# Patient Record
Sex: Male | Born: 1985 | Race: White | Hispanic: No | Marital: Single | Smoking: Current every day smoker
Health system: Southern US, Community
[De-identification: ages and names within clinical notes are randomized; demographics above are authoritative.]

## PROBLEM LIST (undated history)

## (undated) DIAGNOSIS — L729 Follicular cyst of the skin and subcutaneous tissue, unspecified: Secondary | ICD-10-CM

## (undated) DIAGNOSIS — R05 Cough: Secondary | ICD-10-CM

## (undated) DIAGNOSIS — T07XXXA Unspecified multiple injuries, initial encounter: Secondary | ICD-10-CM

## (undated) DIAGNOSIS — R0989 Other specified symptoms and signs involving the circulatory and respiratory systems: Secondary | ICD-10-CM

## (undated) DIAGNOSIS — K37 Unspecified appendicitis: Secondary | ICD-10-CM

## (undated) DIAGNOSIS — K029 Dental caries, unspecified: Secondary | ICD-10-CM

## (undated) DIAGNOSIS — R569 Unspecified convulsions: Secondary | ICD-10-CM

## (undated) HISTORY — PX: APPENDECTOMY: SHX54

---

## 2009-08-19 ENCOUNTER — Emergency Department (HOSPITAL_COMMUNITY): Admission: EM | Admit: 2009-08-19 | Discharge: 2009-08-19 | Payer: Self-pay | Admitting: Emergency Medicine

## 2011-04-19 ENCOUNTER — Encounter: Payer: Self-pay | Admitting: *Deleted

## 2011-04-19 ENCOUNTER — Emergency Department (HOSPITAL_COMMUNITY)
Admission: EM | Admit: 2011-04-19 | Discharge: 2011-04-19 | Discharge status: Against Medical Advice | Payer: Self-pay | Attending: Emergency Medicine | Admitting: Emergency Medicine

## 2011-04-19 DIAGNOSIS — K029 Dental caries, unspecified: Secondary | ICD-10-CM | POA: Insufficient documentation

## 2011-04-19 DIAGNOSIS — K089 Disorder of teeth and supporting structures, unspecified: Secondary | ICD-10-CM | POA: Insufficient documentation

## 2011-04-19 NOTE — ED Notes (Signed)
Patient c/o toothache onset several months ago , states it has just gotten really bad in the past month. 3-4 rotten upper teeth on the left.

## 2011-04-19 NOTE — ED Notes (Signed)
No answer x1

## 2011-04-25 ENCOUNTER — Encounter (HOSPITAL_COMMUNITY): Payer: Self-pay | Admitting: Emergency Medicine

## 2011-04-25 ENCOUNTER — Emergency Department (HOSPITAL_COMMUNITY)
Admission: EM | Admit: 2011-04-25 | Discharge: 2011-04-25 | Disposition: A | Discharge status: Home / Self Care | Payer: Self-pay | Attending: Emergency Medicine | Admitting: Emergency Medicine

## 2011-04-25 DIAGNOSIS — K029 Dental caries, unspecified: Secondary | ICD-10-CM | POA: Insufficient documentation

## 2011-04-25 DIAGNOSIS — K047 Periapical abscess without sinus: Secondary | ICD-10-CM | POA: Insufficient documentation

## 2011-04-25 DIAGNOSIS — R599 Enlarged lymph nodes, unspecified: Secondary | ICD-10-CM | POA: Insufficient documentation

## 2011-04-25 MED ORDER — PENICILLIN V POTASSIUM 250 MG PO TABS: 250.0000 mg | ORAL_TABLET | Freq: Four times a day (QID) | ORAL | Status: AC

## 2011-04-25 MED ORDER — OXYCODONE-ACETAMINOPHEN 5-325 MG PO TABS: 1.0000 | ORAL_TABLET | Freq: Four times a day (QID) | ORAL | Status: AC | PRN

## 2011-04-25 NOTE — ED Provider Notes (Signed)
History     CSN: [id number] Arrival date & time: 04/25/2011  9:15 AM   First MD Initiated Contact with Patient 04/25/11 [phone]      Chief Complaint  Patient presents with  . Dental Pain    left upper jaw  . Facial Swelling    knot under left jaw    (Consider location/radiation/quality/duration/timing/severity/associated sxs/prior treatment) The history is provided by the patient.    Pt presents to the ED with complaints of dental abscess that he stuck a needle into himself. He has widespread tooth decay and gingivitis. He also complains of a non painful lump on the side of his neck. Denies fevers, N/V/D/neck soreness. Pt says taht he attempted to get his teeth pulled by the dentist said he couldn't do it while its infected.  History reviewed. No pertinent past medical history.  Past Surgical History  Procedure Date  . Appendectomy     No family history on file.  History  Substance Use Topics  . Smoking status: Current Everyday Smoker -- 1.0 packs/day  . Smokeless tobacco: Not on file  . Alcohol Use: No      Review of Systems  All other systems reviewed and are negative.    Allergies  Ultram  Home Medications   Current Outpatient Rx  Name Route Sig Dispense Refill  . ACETAMINOPHEN 500 MG PO TABS Oral Take 1,000 mg by mouth every 6 (six) hours as needed. For pain       BP 119/78  Pulse 84  Temp(Src) 98.1 F (36.7 C) (Oral)  Ht 5\' 9"  (1.753 m)  Wt 128 lb (58.06 kg)  BMI 18.90 kg/m2  SpO2 98%  Physical Exam  Constitutional: He is oriented to person, place, and time. He appears well-developed and well-nourished.  HENT:  Head: Normocephalic and atraumatic.  Mouth/Throat: Uvula is midline and oropharynx is clear and moist. Dental abscesses and dental caries present.  Eyes: EOM are normal. Pupils are equal, round, and reactive to light.  Neck: Normal range of motion.  Cardiovascular: Normal rate and regular rhythm.   Pulmonary/Chest: Effort normal and  breath sounds normal.  Musculoskeletal: Normal range of motion.  Lymphadenopathy:       Head (left side): Submandibular (node is enlarged, nontender, smooth, mobile and not indurated. appears to be bengign in reaction to the infection in his teeth) adenopathy present.  Neurological: He is alert and oriented to person, place, and time.  Skin: Skin is warm and dry.    ED Course  Procedures (including critical care time)  Labs Reviewed - No data to display No results found.   No diagnosis found.    MDM  Discussed the patient seeing a dentist right at the end of his abx treatment for best chance of getting teeth pulled.       Dorthula Matas, PA 04/25/11 1010

## 2011-04-25 NOTE — ED Notes (Signed)
Pt has dental pain. Obvious dental caries, gums were swollen.  Last night pt stuck needle into gums to release the pressure

## 2011-04-25 NOTE — ED Provider Notes (Signed)
Medical screening examination/treatment/procedure(s) were performed by non-physician practitioner and as supervising physician I was immediately available for consultation/collaboration.  Press Casale R. Rumi Taras, MD 04/25/11 1529 

## 2011-08-26 ENCOUNTER — Emergency Department (HOSPITAL_COMMUNITY)
Admission: EM | Admit: 2011-08-26 | Discharge: 2011-08-26 | Disposition: A | Discharge status: Home / Self Care | Payer: Self-pay | Attending: Emergency Medicine | Admitting: Emergency Medicine

## 2011-08-26 ENCOUNTER — Encounter (HOSPITAL_COMMUNITY): Payer: Self-pay | Admitting: *Deleted

## 2011-08-26 DIAGNOSIS — F17200 Nicotine dependence, unspecified, uncomplicated: Secondary | ICD-10-CM | POA: Insufficient documentation

## 2011-08-26 DIAGNOSIS — Z886 Allergy status to analgesic agent status: Secondary | ICD-10-CM | POA: Insufficient documentation

## 2011-08-26 DIAGNOSIS — K047 Periapical abscess without sinus: Secondary | ICD-10-CM | POA: Insufficient documentation

## 2011-08-26 DIAGNOSIS — K029 Dental caries, unspecified: Secondary | ICD-10-CM | POA: Insufficient documentation

## 2011-08-26 MED ORDER — PENICILLIN V POTASSIUM 500 MG PO TABS: 500.0000 mg | ORAL_TABLET | Freq: Four times a day (QID) | ORAL | Status: AC

## 2011-08-26 MED ORDER — IBUPROFEN 800 MG PO TABS: 800.0000 mg | ORAL_TABLET | Freq: Three times a day (TID) | ORAL | Status: AC | PRN

## 2011-08-26 MED ORDER — HYDROCODONE-ACETAMINOPHEN 5-325 MG PO TABS: 1.0000 | ORAL_TABLET | Freq: Four times a day (QID) | ORAL | Status: AC | PRN

## 2011-08-26 MED ORDER — PENICILLIN V POTASSIUM 500 MG PO TABS: 500.0000 mg | ORAL_TABLET | Freq: Four times a day (QID) | ORAL | Status: DC

## 2011-08-26 NOTE — ED Notes (Signed)
PA at bedside.

## 2011-08-26 NOTE — ED Notes (Signed)
Pt from home with reports of left facial swelling due to rotten tooth on top left, also reports minimal pain, symptoms began Wednesday with swelling that began last night.

## 2011-08-26 NOTE — Discharge Instructions (Signed)
Please call the dentist listed above for a follow up appointment.  You should call the office within 48 hours of being seen in the ER and let them know you were seen here.  If you develop high fevers, swelling that involves your eye, difficulty swallowing or breathing, or are unable to tolerate fluids by mouth, return to the ER immediately.  You may return to the ER at any time for worsening condition or any new symptoms that concern you.  Dental Abscess A dental abscess usually starts from an infected tooth. Antibiotic medicine and pain pills can be helpful, but dental infections require the attention of a dentist. Rinse around the infected area often with salt water (a pinch of salt in 8 oz of warm water). Do not apply heat to the outside of your face. See your dentist or oral surgeon as soon as possible.  SEEK IMMEDIATE MEDICAL CARE IF:  You have increasing, severe pain that is not relieved by medicine.   You or your child has an oral temperature above 102 F (38.9 C), not controlled by medicine.   Your baby is older than 3 months with a rectal temperature of 102 F (38.9 C) or higher.   Your baby is 80 months old or younger with a rectal temperature of 100.4 F (38 C) or higher.   You develop chills, severe headache, difficulty breathing, or trouble swallowing.   You have swelling in the neck or around the eye.  Document Released: 05/01/2005 Document Revised: 04/20/2011 Document Reviewed: 10/10/2006 [facility] Patient Information 2012 [place], [place].

## 2011-08-26 NOTE — ED Provider Notes (Signed)
History     CSN: [id number]  Arrival date & time 08/26/11  1006   First MD Initiated Contact with Patient 08/26/11 1029      Chief Complaint  Patient presents with  . Dental Pain  . Facial Swelling    (Consider location/radiation/quality/duration/timing/severity/associated sxs/prior treatment) HPI Comments: Patient reports left upper molar pain that began 4 days ago.  States he now have swelling that began last night.  Subjective fevers, intermittent sore throat.  Denies any difficulty swallowing or breathing.  No known medical problems. Pt does not have medical insurance and has been calling dentists that all require money up front, therefore unable to see dentist.    Patient is a 26 y.o. male presenting with tooth pain. The history is provided by the patient.  Dental PainThe primary symptoms include sore throat. Primary symptoms do not include fever or shortness of breath.  The sore throat is not accompanied by trouble swallowing, drooling or stridor.   Additional symptoms do not include: trouble swallowing and drooling.    History reviewed. No pertinent past medical history.  Past Surgical History  Procedure Date  . Appendectomy     History reviewed. No pertinent family history.  History  Substance Use Topics  . Smoking status: Current Everyday Smoker -- 1.0 packs/day    Types: Cigarettes  . Smokeless tobacco: Never Used  . Alcohol Use: Yes     seldom      Review of Systems  Constitutional: Negative for fever and chills.  HENT: Positive for sore throat and dental problem. Negative for drooling and trouble swallowing.   Respiratory: Negative for shortness of breath, wheezing and stridor.   Cardiovascular: Negative for chest pain.  Gastrointestinal: Negative for vomiting and abdominal pain.  All other systems reviewed and are negative.    Allergies  Aspirin and Ultram  Home Medications   Current Outpatient Rx  Name Route Sig Dispense Refill  . IBUPROFEN  200 MG PO TABS Oral Take 200 mg by mouth every 6 (six) hours as needed. pain      BP 132/68  Pulse 78  Temp(Src) 97.7 F (36.5 C) (Oral)  Resp 18  SpO2 100%  Physical Exam  Nursing note and vitals reviewed. Constitutional: He is oriented to person, place, and time. He appears well-developed and well-nourished.  HENT:  Head: Normocephalic and atraumatic.  Mouth/Throat:         Left upper molars with severe decay, swelling adjacent and superior to this area.  Left lower jaw also with area of swelling and associated rubbery nonfixed lymph node.    Neck: Neck supple.  Pulmonary/Chest: Effort normal.  Neurological: He is alert and oriented to person, place, and time.    ED Course  Procedures (including critical care time)  Labs Reviewed - No data to display No results found.   1. Dental abscess       MDM  Afebrile nontoxic patient with advanced dental decay in multiple teeth.  Now with dental abscess.  No airway involvement at this time.  Pt d/c home with dental follow up.  Return precautions discussed.  Patient verbalizes understanding and agrees with plan.         Rise Patience, [place] 08/26/11 1355

## 2011-09-09 NOTE — ED Provider Notes (Signed)
Evaluation and management procedures were performed by the PA/NP/resident physician under my supervision/collaboration.   Kiano Terrien D Adylee Leonardo, MD 09/09/11 2017 

## 2012-06-07 ENCOUNTER — Emergency Department (HOSPITAL_COMMUNITY): Payer: Medicaid Other

## 2012-06-07 ENCOUNTER — Encounter (HOSPITAL_COMMUNITY): Payer: Self-pay | Admitting: Emergency Medicine

## 2012-06-07 ENCOUNTER — Emergency Department (HOSPITAL_COMMUNITY)
Admission: EM | Admit: 2012-06-07 | Discharge: 2012-06-07 | Disposition: A | Discharge status: Home / Self Care | Payer: Medicaid Other | Attending: Emergency Medicine | Admitting: Emergency Medicine

## 2012-06-07 DIAGNOSIS — Y939 Activity, unspecified: Secondary | ICD-10-CM | POA: Insufficient documentation

## 2012-06-07 DIAGNOSIS — X58XXXA Exposure to other specified factors, initial encounter: Secondary | ICD-10-CM | POA: Insufficient documentation

## 2012-06-07 DIAGNOSIS — F17200 Nicotine dependence, unspecified, uncomplicated: Secondary | ICD-10-CM | POA: Insufficient documentation

## 2012-06-07 DIAGNOSIS — Y929 Unspecified place or not applicable: Secondary | ICD-10-CM | POA: Insufficient documentation

## 2012-06-07 DIAGNOSIS — I889 Nonspecific lymphadenitis, unspecified: Secondary | ICD-10-CM

## 2012-06-07 LAB — COMPREHENSIVE METABOLIC PANEL
ALT: 13 U/L (ref 0–53)
Calcium: 9.5 mg/dL (ref 8.4–10.5)
GFR calc Af Amer: >90 mL/min (ref >90)
Glucose, Bld: 87 mg/dL (ref 70–99)
Sodium: 138 mEq/L (ref 135–145)
Total Protein: 7.8 g/dL (ref 6.0–8.3)

## 2012-06-07 LAB — CBC WITH DIFFERENTIAL/PLATELET
Basophils Absolute: 0 10*3/uL (ref 0.0–0.1)
Basophils Relative: 0 % (ref 0–1)
Eosinophils Absolute: 0.2 10*3/uL (ref 0.0–0.7)
Eosinophils Relative: 1 % (ref 0–5)
Lymphs Abs: 2.3 10*3/uL (ref 0.7–4.0)
MCH: 32.9 pg (ref 26.0–34.0)
MCHC: 35.8 g/dL (ref 30.0–36.0)
MCV: 91.8 fL (ref 78.0–100.0)
Platelets: 160 10*3/uL (ref 150–400)
RDW: 13.9 % (ref 11.5–15.5)

## 2012-06-07 MED ORDER — AMOXICILLIN-POT CLAVULANATE 875-125 MG PO TABS: 1.0000 | ORAL_TABLET | Freq: Two times a day (BID) | ORAL | Status: DC

## 2012-06-07 MED ORDER — IOHEXOL 300 MG/ML  SOLN
80.0000 mL | Freq: Once | INTRAMUSCULAR | Status: AC | PRN
  Administered 2012-06-07: 80 mL via INTRAVENOUS

## 2012-06-07 MED ORDER — DEXTROSE 5 % IV SOLN
1.0000 g | INTRAVENOUS | Status: DC
  Administered 2012-06-07: 1 g via INTRAVENOUS
  Filled 2012-06-07: qty 10

## 2012-06-07 MED ORDER — SODIUM CHLORIDE 0.9 % IV SOLN
Freq: Once | INTRAVENOUS | Status: AC
  Administered 2012-06-07: 1000 mL via INTRAVENOUS

## 2012-06-07 MED ORDER — VANCOMYCIN HCL IN DEXTROSE 1-5 GM/200ML-% IV SOLN
1000.0000 mg | Freq: Once | INTRAVENOUS | Status: AC
  Administered 2012-06-07: 1000 mg via INTRAVENOUS
  Filled 2012-06-07: qty 200

## 2012-06-07 NOTE — ED Notes (Signed)
Knot on jaw that has gotten bigger ? Tooth that is bad x 1 week

## 2012-06-07 NOTE — ED Notes (Signed)
Patient transported to CT 

## 2012-06-07 NOTE — ED Provider Notes (Signed)
Medical screening examination/treatment/procedure(s) were performed by non-physician practitioner and as supervising physician I was immediately available for consultation/collaboration.   Emoree Sasaki J. Dayra Rapley, MD 06/07/12 1612 

## 2012-06-07 NOTE — ED Notes (Signed)
Patient transported to Ultrasound 

## 2012-06-07 NOTE — ED Provider Notes (Signed)
History     CSN: [id number]  Arrival date & time 06/07/12  1353   None     No chief complaint on file.   (Consider location/radiation/quality/duration/timing/severity/associated sxs/prior treatment) Patient is a 27 y.o. male presenting with neck injury. The history is provided by the patient. No language interpreter was used.  Neck Injury This is a new problem. The problem occurs constantly. The problem has been gradually worsening. Associated symptoms include neck pain. Associated symptoms comments: Dental infection  In the pst with swollen glands. Pt reports he had a lymph node that did not go down.  . Nothing aggravates the symptoms. He has tried nothing for the symptoms. The treatment provided moderate relief.    History reviewed. No pertinent past medical history.  Past Surgical History  Procedure Date  . Appendectomy     No family history on file.  History  Substance Use Topics  . Smoking status: Current Every Day Smoker -- 1.0 packs/day    Types: Cigarettes  . Smokeless tobacco: Never Used  . Alcohol Use: Yes     Comment: seldom      Review of Systems  HENT: Positive for facial swelling and neck pain.   All other systems reviewed and are negative.    Allergies  Ultram  Home Medications   Current Outpatient Rx  Name  Route  Sig  Dispense  Refill  . ASPIRIN 325 MG PO TABS   Oral   Take 325 mg by mouth daily.           BP 145/76  Pulse 86  Temp 98.5 F (36.9 C)  Resp 16  SpO2 100%  Physical Exam  Nursing note and vitals reviewed. Constitutional: He is oriented to person, place, and time. He appears well-developed and well-nourished.  HENT:  Head: Normocephalic.       Large swollen area left mandibular curve,  3 cm area,  1cm lymph node occipital  Eyes: Pupils are equal, round, and reactive to light.  Neck: Normal range of motion.  Cardiovascular: Normal rate.   Pulmonary/Chest: Effort normal.  Musculoskeletal: Normal range of motion.   Neurological: He is alert and oriented to person, place, and time. He has normal reflexes.  Skin: Skin is warm.  Psychiatric: He has a normal mood and affect.    ED Course  Procedures (including critical care time)  Labs Reviewed - No data to display No results found.   No diagnosis found.    MDM  Pt's care turned over to Felicie Morn NP labs, ultrasound and antibiotics       Lonia Skinner [place], [place] 06/07/12 1540

## 2012-06-09 DIAGNOSIS — D72829 Elevated white blood cell count, unspecified: Secondary | ICD-10-CM | POA: Diagnosis present

## 2012-06-09 DIAGNOSIS — Z9119 Patient's noncompliance with other medical treatment and regimen: Secondary | ICD-10-CM

## 2012-06-09 DIAGNOSIS — F17200 Nicotine dependence, unspecified, uncomplicated: Secondary | ICD-10-CM | POA: Diagnosis present

## 2012-06-09 DIAGNOSIS — L0211 Cutaneous abscess of neck: Principal | ICD-10-CM | POA: Diagnosis present

## 2012-06-09 DIAGNOSIS — L723 Sebaceous cyst: Secondary | ICD-10-CM | POA: Diagnosis present

## 2012-06-09 DIAGNOSIS — Z91199 Patient's noncompliance with other medical treatment and regimen due to unspecified reason: Secondary | ICD-10-CM

## 2012-06-09 DIAGNOSIS — I889 Nonspecific lymphadenitis, unspecified: Secondary | ICD-10-CM | POA: Diagnosis present

## 2012-06-09 NOTE — ED Notes (Signed)
PT. REPORTS PROGRESSING ABSCESS AT LEFT NECK WITH SWELLING / REDDNESS, NO DRAINAGE, SEEN HERE 06/07/2012 PRESCRIBED WITH AUGMENTIN ANTIBIOTIC WITH NO IMPROVEMENT.

## 2012-06-10 ENCOUNTER — Encounter (HOSPITAL_COMMUNITY): Payer: Self-pay | Admitting: Emergency Medicine

## 2012-06-10 ENCOUNTER — Inpatient Hospital Stay (HOSPITAL_COMMUNITY)
Admission: EM | Admit: 2012-06-10 | Discharge: 2012-06-13 | DRG: 603 | Disposition: A | Discharge status: Home / Self Care | Payer: Medicaid Other | Attending: Internal Medicine | Admitting: Internal Medicine

## 2012-06-10 DIAGNOSIS — M7989 Other specified soft tissue disorders: Secondary | ICD-10-CM

## 2012-06-10 DIAGNOSIS — Z9119 Patient's noncompliance with other medical treatment and regimen: Secondary | ICD-10-CM

## 2012-06-10 DIAGNOSIS — L723 Sebaceous cyst: Secondary | ICD-10-CM | POA: Diagnosis present

## 2012-06-10 DIAGNOSIS — R221 Localized swelling, mass and lump, neck: Secondary | ICD-10-CM | POA: Diagnosis present

## 2012-06-10 DIAGNOSIS — R220 Localized swelling, mass and lump, head: Secondary | ICD-10-CM

## 2012-06-10 DIAGNOSIS — I889 Nonspecific lymphadenitis, unspecified: Secondary | ICD-10-CM

## 2012-06-10 DIAGNOSIS — L089 Local infection of the skin and subcutaneous tissue, unspecified: Secondary | ICD-10-CM

## 2012-06-10 DIAGNOSIS — D72829 Elevated white blood cell count, unspecified: Secondary | ICD-10-CM | POA: Diagnosis present

## 2012-06-10 LAB — CBC WITH DIFFERENTIAL/PLATELET
Basophils Relative: 0 % (ref 0–1)
HCT: 40.4 % (ref 39.0–52.0)
Hemoglobin: 13.9 g/dL (ref 13.0–17.0)
Lymphocytes Relative: 21 % (ref 12–46)
MCHC: 34.4 g/dL (ref 30.0–36.0)
Monocytes Absolute: 1.1 10*3/uL — ABNORMAL HIGH (ref 0.1–1.0)
Monocytes Relative: 10 % (ref 3–12)
Neutro Abs: 7.7 10*3/uL (ref 1.7–7.7)

## 2012-06-10 LAB — COMPREHENSIVE METABOLIC PANEL
BUN: 7 mg/dL (ref 6–23)
CO2: 30 mEq/L (ref 19–32)
Chloride: 102 mEq/L (ref 96–112)
Creatinine, Ser: 0.82 mg/dL (ref 0.50–1.35)
GFR calc Af Amer: >90 mL/min (ref >90)
GFR calc non Af Amer: >90 mL/min (ref >90)
Glucose, Bld: 85 mg/dL (ref 70–99)
Total Bilirubin: 0.2 mg/dL — ABNORMAL LOW (ref 0.3–1.2)

## 2012-06-10 MED ORDER — ONDANSETRON HCL 4 MG/2ML IJ SOLN: 4.0000 mg | Freq: Three times a day (TID) | INTRAMUSCULAR | Status: DC | PRN

## 2012-06-10 MED ORDER — SODIUM CHLORIDE 0.9 % IV SOLN: 250.0000 mL | INTRAVENOUS | Status: DC | PRN

## 2012-06-10 MED ORDER — IBUPROFEN 600 MG PO TABS
600.0000 mg | ORAL_TABLET | Freq: Three times a day (TID) | ORAL | Status: DC | PRN
  Administered 2012-06-11: 600 mg via ORAL
  Filled 2012-06-10 (×2): qty 1

## 2012-06-10 MED ORDER — SODIUM CHLORIDE 0.9 % IV SOLN
3.0000 g | Freq: Four times a day (QID) | INTRAVENOUS | Status: DC
  Administered 2012-06-10 – 2012-06-13 (×13): 3 g via INTRAVENOUS
  Filled 2012-06-10 (×15): qty 3

## 2012-06-10 MED ORDER — SODIUM CHLORIDE 0.9 % IV SOLN
3.0000 g | Freq: Once | INTRAVENOUS | Status: AC
  Administered 2012-06-10: 3 g via INTRAVENOUS
  Filled 2012-06-10: qty 3

## 2012-06-10 MED ORDER — SODIUM CHLORIDE 0.9 % IJ SOLN: 3.0000 mL | INTRAMUSCULAR | Status: DC | PRN

## 2012-06-10 MED ORDER — LACTATED RINGERS IV SOLN: INTRAVENOUS | Status: DC

## 2012-06-10 MED ORDER — SODIUM CHLORIDE 0.9 % IJ SOLN
3.0000 mL | Freq: Two times a day (BID) | INTRAMUSCULAR | Status: DC
  Administered 2012-06-10 – 2012-06-13 (×4): 3 mL via INTRAVENOUS

## 2012-06-10 NOTE — H&P (Signed)
Chief Complaint:  Neck swelling  HPI: 27 yo healthy male came to ED over 2 days ago with a small swelling under his left mandible was dx with lymphadenitis given a dose of iv vanc and rocephin and a script for po augmentin and told to f/u with ENT as outpt.  However due to cost issues he could not fill the augmentin for 2 days and has only taken one dose of the augmentin.  Since that time the size of this "swelling" has over doubled in size.  There has been no draining from the area and there is increase in the redness around the area over the last 48 hours.  Eating well.  No n/v.  No fevers.  Review of Systems:  O/w neg  Past Medical History: History reviewed. No pertinent past medical history. Past Surgical History  Procedure Date  . Appendectomy     Medications: Prior to Admission medications   Medication Sig Start Date End Date Taking? Authorizing Provider  amoxicillin-clavulanate (AUGMENTIN) 875-125 MG per tablet Take 1 tablet by mouth every 12 (twelve) hours. 06/07/12  Yes Jimmye Norman, NP  ibuprofen (ADVIL,MOTRIN) 200 MG tablet Take 200-400 mg by mouth every 6 (six) hours as needed. For fever   Yes Historical Provider, MD    Allergies:   Allergies  Allergen Reactions  . Ultram (Tramadol Hcl) Swelling    Social History:  reports that he has been smoking Cigarettes.  He has been smoking about 1 pack per day. He has never used smokeless tobacco. He reports that he drinks alcohol. He reports that he does not use illicit drugs.  Family History: neg  Physical Exam: Filed Vitals:   06/09/12 2359  BP: 120/79  Pulse: 74  Temp: 98.4 F (36.9 C)  TempSrc: Oral  Resp: 14  SpO2: 99%   General appearance: alert, cooperative and no distress Neck: no JVD and supple, symmetrical, trachea midline large mass about 3-4 cm with fluntuance c/w absess under left mandible with surrounding erythema down to ant neck c/w cellulitis there is no spontaneous drainage anywhere Lungs:  clear to auscultation bilaterally Heart: regular rate and rhythm, S1, S2 normal, no murmur, click, rub or gallop Abdomen: soft, non-tender; bowel sounds normal; no masses,  no organomegaly Extremities: extremities normal, atraumatic, no cyanosis or edema Pulses: 2+ and symmetric Skin: Skin color, texture, turgor normal. No rashes or lesions Neurologic: Grossly normal    Labs on Admission:   [facility] 06/10/12 0023 06/07/12 1533  NA 140 138  K 4.2 4.7  CL 102 100  CO2 30 27  GLUCOSE 85 87  BUN 7 6  CREATININE 0.82 0.78  CALCIUM 9.5 9.5  MG -- --  PHOS -- --    Basename 06/10/12 0023 06/07/12 1533  AST 23 29  ALT 11 13  ALKPHOS 55 53  BILITOT 0.2* 0.3  PROT 7.4 7.8  ALBUMIN 4.0 4.5    Basename 06/10/12 0023 06/07/12 1533  WBC 11.5* 11.8*  NEUTROABS 7.7 8.5*  HGB 13.9 16.1  HCT 40.4 45.0  MCV 91.8 91.8  PLT 140* 160   Radiological Exams on Admission: Ct Soft Tissue Neck W Contrast  06/07/2012  *RADIOLOGY REPORT*  Clinical Data: Knot on the left sided neck.  Hurts when turning.  CT NECK WITH CONTRAST  Technique:  Multidetector CT imaging of the neck was performed with intravenous contrast.  Contrast: 80mL OMNIPAQUE IOHEXOL 300 MG/ML  SOLN  Comparison: 06/07/2012 ultrasound.  Findings: Inferior to the left mandibular ramus -  body junction there is a nonspecific 2.9 x 1.8 x 2.5 cm mass corresponds to the patient's palpable abnormality  and ultrasound abnormality.  This is of low density but heterogeneous and does not appear to be a simple cyst on the corresponding ultrasound.  Diffuse inflammation of surrounding fat planes.  Etiology of this is indeterminate. This may represent infected brachial cleft abnormality, infected lymph node or primary mass.  Increased number of normal size to minimally prominent sized lymph nodes throughout the neck otherwise noted possibly reactive origin.  Primary neck mass otherwise not identified.  Very minimal asymmetry palatine tonsils greater on  the left.  Caries.  No evidence of jugular vein thrombosis.  Biapical bullae/blebs.  IMPRESSION: Inferior to the left mandibular ramus - body junction there is a nonspecific 2.9 x 1.8 x 2.5 cm mass corresponds to the patient's palpable abnormality  and ultrasound abnormality.  This is of low density but heterogeneous and does not appear to be a simple cyst on the corresponding ultrasound.  Diffuse inflammation of surrounding fat planes.  Etiology of this is indeterminate.  This may represent infected brachial cleft abnormality, infected lymph node or primary mass.  Increased number of normal size to minimally prominent sized lymph nodes throughout the neck otherwise noted possibly reactive origin.  Primary neck mass otherwise not identified.  Very minimal asymmetry palatine tonsils greater on the left.  Caries.   Original Report Authenticated By: Lacy Duverney, M.D.    US Soft Tissue Head/neck  06/07/2012  *RADIOLOGY REPORT*  Clinical Data: Left neck mass.  ULTRASOUND OF HEAD/NECK SOFT TISSUES  Technique:  Ultrasound examination of the head and neck soft tissues was performed in the area of clinical concern.  Comparison:  None.  Findings: The patients palpable abnormality near the angle of the mandible in the submandibular region corresponds to a 2.4 x 1.3 x 2.8 cm mass.  This is likely a solid mass but could potentially be an infected or hemorrhagic cyst.  It is quite superficial and is most likely an enlarged and infected lymph node (lymphadenitis). This does not have typical sonographic features of an abscess. Further evaluation with contrast enhanced CT scan of the neck would be helpful.  IMPRESSION:  2.4 x 1.3 x 2.8 cm superficial mass in the left submandibular region corresponding to the patient's palpable abnormality.  This is most likely lymphadenitis but could be a complex cyst or solid mass.  Further evaluation with contrast enhanced neck CT is suggested.   Original Report Authenticated By: Rudie Meyer,  M.D.     Assessment/Plan 27 yo male with left submandibular mass/absess/cellulitis  Principal Problem:  *Mass of neck Active Problems:  Medically noncompliant  Iv unasyn.  ENT already called to see pt for i&D, will keep npo and hold anticoagulants until surgical plan clear.  Med bed.  Obs.  Full code.  Breena Bevacqua A 06/10/2012, 4:04 AM

## 2012-06-10 NOTE — ED Notes (Addendum)
Pt was here 1/24 for an abscess on his L lower jaw.  Pt was given Vancomycin and Rocephin here and discharged on Augmentin PO.  Pt states that abscess has doubled in size since his visit.  Pt denies any dyspnea.  MD at bedside. Pt states that his prescription was not filled until 1/26 due to a lack of funds.
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
## 2012-06-10 NOTE — ED Provider Notes (Signed)
History     CSN: [id number]  Arrival date & time 06/09/12  2344   First MD Initiated Contact with Patient 06/10/12 [phone]      Chief Complaint  Patient presents with  . Abscess    (Consider location/radiation/quality/duration/timing/severity/associated sxs/prior treatment) HPIJoshua Bates is a 27 y.o. male with a history of a left-sided submandibular persisting lymph node that was irrigated approximately one week ago when he is put into a hep lock during a disagreement with a friend. After this disagreement, lymph node started swelling until the patient was seen in emergency department 3 days ago. Patient was evaluated with a CT of the head and neck as well as ultrasound of the neck. It is felt that no surgical intervention was required at that time-he was given IV antibiotics and discharged home with Augmentin. Patient did not get the Augmentin filled, 48 hours had passed before he took his first dose of antibiotics, this was earlier today and then he decided to come to the emergency department because the swelling in the lymph node had doubled. Is having pain in the region which is moderate to severe as well as fevers. He has no airway involvement, denies any shortness of breath, throat swelling, sensation in his throat is closing, headaches, nausea, vomiting, diarrhea, abdominal pain, chest pain.  History reviewed. No pertinent past medical history.  Past Surgical History  Procedure Date  . Appendectomy     No family history on file.  History  Substance Use Topics  . Smoking status: Current Every Day Smoker -- 1.0 packs/day    Types: Cigarettes  . Smokeless tobacco: Never Used  . Alcohol Use: Yes     Comment: seldom      Review of Systems At least 10pt or greater review of systems completed and are negative except where specified in the HPI.  Allergies  Ultram  Home Medications   Current Outpatient Rx  Name  Route  Sig  Dispense  Refill  . AMOXICILLIN-POT  CLAVULANATE 875-125 MG PO TABS   Oral   Take 1 tablet by mouth every 12 (twelve) hours.   14 tablet   0   . IBUPROFEN 200 MG PO TABS   Oral   Take 200-400 mg by mouth every 6 (six) hours as needed. For fever           BP 120/79  Pulse 74  Temp 98.4 F (36.9 C) (Oral)  Resp 14  SpO2 99%  Physical Exam  Nursing notes reviewed.  Electronic medical record reviewed. VITAL SIGNS:   Filed Vitals:   06/09/12 2359 06/10/12 0519 06/10/12 0533  BP: 120/79 112/70 119/67  Pulse: 74 66 64  Temp: 98.4 F (36.9 C) 97.6 F (36.4 C) 97.7 F (36.5 C)  TempSrc: Oral Oral Oral  Resp: 14 16 18   Height:   5\' 9"  (1.753 m)  Weight:   127 lb 3.2 oz (57.698 kg)  SpO2: 99% 100% 98%   CONSTITUTIONAL: Awake, oriented, appears non-toxic HENT: Atraumatic, normocephalic, oral mucosa pink and moist, airway patent. Nares patent without drainage. External ears normal. EYES: Conjunctiva clear, EOMI, PERRLA NECK: Trachea midline, non-tender, supple. Patient has a large erythematous mass at the left inferior submandibular region. This is about midway between the angle of the jaw and the mental protuberance.  This is firm and tender to palpation, there is also an area of surrounding mild cellulitis that extends down the patient left lateral neck and crosses the midline. Patient has some other posterior reactive  nodes  CARDIOVASCULAR: Normal heart rate, Normal rhythm, No murmurs, rubs, gallops PULMONARY/CHEST: Clear to auscultation, no rhonchi, wheezes, or rales. Symmetrical breath sounds. Non-tender. ABDOMINAL: Non-distended, soft, non-tender - no rebound or guarding.  BS normal. NEUROLOGIC: Non-focal, moving all four extremities, no gross sensory or motor deficits. EXTREMITIES: No clubbing, cyanosis, or edema SKIN: Warm, Dry, No erythema, No rash  ED Course  Procedures (including critical care time)  Labs Reviewed  CBC WITH DIFFERENTIAL - Abnormal; Notable for the following:    WBC 11.5 (*)      Platelets 140 (*)     Monocytes Absolute 1.1 (*)     All other components within normal limits  COMPREHENSIVE METABOLIC PANEL - Abnormal; Notable for the following:    Total Bilirubin 0.2 (*)     All other components within normal limits   No results found.   1. Lymphadenitis   2. Mass of neck   3. Medically noncompliant       MDM  [name] is a 27 y.o. male presenting with worsening lymphadenitis after he failed to fill a antibiotic prescription. It is difficult to tell whether or not antibiotics would've helped, however now the lymph node is quite a bit larger and a bedside ultrasound seems to have a collection of hypoechoic fluid within the center of the node. Think this patient has failed outpatient therapy and should be admitted for IV antibiotic therapy as well as evaluation by ENT. Discussed this case with Dr. Jenne Pane - to see pt in AM for evaluation.  Drainage does not need to be done emergently, no airway involvement.   D/W Dr. Montel Culver for admission from [facility]         Jones Skene, MD 06/10/12 [phone]

## 2012-06-10 NOTE — Progress Notes (Signed)
   Patient seen earlier today by my colleague Dr. Onalee Hua.  Patient seen and examined by me, had a base reviewed.  Left-sided facial cellulitis with fluid filled mass versus lymphangitis.  Dr. Jenne Pane of ENT service is following, patient is on Unasyn continue.  Clint Lipps Pager: [phone] 06/10/2012, 3:33 PM

## 2012-06-10 NOTE — Progress Notes (Signed)
VASCULAR LAB PRELIMINARY  PRELIMINARY  PRELIMINARY  PRELIMINARY  Left upper extremity venous Doppler completed.    Preliminary report:  There is no DVT or SVT noted in the left upper extremity.    Tobenna Needs, RVT 06/10/2012, 11:59 AM

## 2012-06-11 ENCOUNTER — Encounter (HOSPITAL_COMMUNITY): Payer: Self-pay | Admitting: Anesthesiology

## 2012-06-11 ENCOUNTER — Encounter (HOSPITAL_COMMUNITY)
Admission: EM | Disposition: A | Discharge status: Home / Self Care | Payer: Self-pay | Source: Home / Self Care | Attending: Internal Medicine

## 2012-06-11 ENCOUNTER — Inpatient Hospital Stay (HOSPITAL_COMMUNITY): Payer: Medicaid Other | Admitting: Anesthesiology

## 2012-06-11 DIAGNOSIS — D72829 Elevated white blood cell count, unspecified: Secondary | ICD-10-CM

## 2012-06-11 HISTORY — PX: INCISION AND DRAINAGE ABSCESS: SHX5864

## 2012-06-11 LAB — CBC
HCT: 39.7 % (ref 39.0–52.0)
Hemoglobin: 13.7 g/dL (ref 13.0–17.0)
MCHC: 34.5 g/dL (ref 30.0–36.0)
RBC: 4.33 MIL/uL (ref 4.22–5.81)

## 2012-06-11 LAB — BASIC METABOLIC PANEL
BUN: 7 mg/dL (ref 6–23)
Chloride: 104 mEq/L (ref 96–112)
GFR calc Af Amer: >90 mL/min (ref >90)
Potassium: 3.9 mEq/L (ref 3.5–5.1)
Sodium: 139 mEq/L (ref 135–145)

## 2012-06-11 LAB — SURGICAL PCR SCREEN: Staphylococcus aureus: NEGATIVE

## 2012-06-11 SURGERY — INCISION AND DRAINAGE, ABSCESS
Anesthesia: General | Site: Neck | Wound class: Dirty or Infected

## 2012-06-11 MED ORDER — ONDANSETRON HCL 4 MG/2ML IJ SOLN: 4.0000 mg | Freq: Four times a day (QID) | INTRAMUSCULAR | Status: DC | PRN

## 2012-06-11 MED ORDER — FENTANYL CITRATE 0.05 MG/ML IJ SOLN
INTRAMUSCULAR | Status: DC | PRN
  Administered 2012-06-11 (×2): 50 ug via INTRAVENOUS
  Administered 2012-06-11: 100 ug via INTRAVENOUS

## 2012-06-11 MED ORDER — METOCLOPRAMIDE HCL 5 MG/ML IJ SOLN: 10.0000 mg | Freq: Once | INTRAMUSCULAR | Status: DC | PRN

## 2012-06-11 MED ORDER — LACTATED RINGERS IV SOLN
INTRAVENOUS | Status: DC
  Administered 2012-06-11: 1000 mL via INTRAVENOUS
  Administered 2012-06-11: 10:00:00 via INTRAVENOUS

## 2012-06-11 MED ORDER — MIDAZOLAM HCL 5 MG/5ML IJ SOLN
INTRAMUSCULAR | Status: DC | PRN
  Administered 2012-06-11: 2 mg via INTRAVENOUS

## 2012-06-11 MED ORDER — LIDOCAINE-EPINEPHRINE 1 %-1:100000 IJ SOLN
INTRAMUSCULAR | Status: DC | PRN
  Administered 2012-06-11: 1 mL

## 2012-06-11 MED ORDER — LIDOCAINE HCL (CARDIAC) 20 MG/ML IV SOLN
INTRAVENOUS | Status: DC | PRN
  Administered 2012-06-11: 100 mg via INTRAVENOUS

## 2012-06-11 MED ORDER — ACETAMINOPHEN 325 MG PO TABS: 650.0000 mg | ORAL_TABLET | Freq: Four times a day (QID) | ORAL | Status: DC | PRN

## 2012-06-11 MED ORDER — SULFAMETHOXAZOLE-TMP DS 800-160 MG PO TABS
1.0000 | ORAL_TABLET | Freq: Two times a day (BID) | ORAL | Status: DC
  Administered 2012-06-11 – 2012-06-13 (×4): 1 via ORAL
  Filled 2012-06-11 (×5): qty 1

## 2012-06-11 MED ORDER — SUCCINYLCHOLINE CHLORIDE 20 MG/ML IJ SOLN
INTRAMUSCULAR | Status: DC | PRN
  Administered 2012-06-11: 120 mg via INTRAVENOUS

## 2012-06-11 MED ORDER — FENTANYL CITRATE 0.05 MG/ML IJ SOLN: 25.0000 ug | INTRAMUSCULAR | Status: DC | PRN

## 2012-06-11 MED ORDER — PROPOFOL 10 MG/ML IV BOLUS
INTRAVENOUS | Status: DC | PRN
  Administered 2012-06-11: 200 mg via INTRAVENOUS

## 2012-06-11 MED ORDER — ONDANSETRON HCL 4 MG/2ML IJ SOLN
INTRAMUSCULAR | Status: DC | PRN
  Administered 2012-06-11: 4 mg via INTRAVENOUS

## 2012-06-11 MED ORDER — LACTATED RINGERS IV SOLN
INTRAVENOUS | Status: DC | PRN
  Administered 2012-06-11: 10:00:00 via INTRAVENOUS

## 2012-06-11 MED ORDER — METOCLOPRAMIDE HCL 5 MG/ML IJ SOLN
INTRAMUSCULAR | Status: DC | PRN
  Administered 2012-06-11: 10 mg via INTRAVENOUS

## 2012-06-11 MED ORDER — SODIUM CHLORIDE 0.9 % IR SOLN
Status: DC | PRN
  Administered 2012-06-11: 1000 mL

## 2012-06-11 SURGICAL SUPPLY — 25 items
BANDAGE GAUZE ELAST BULKY 4 IN (GAUZE/BANDAGES/DRESSINGS) ×4 IMPLANT
CATH ROBINSON RED A/P 12FR (CATHETERS) ×2 IMPLANT
CLOTH BEACON ORANGE TIMEOUT ST (SAFETY) ×2 IMPLANT
COVER SURGICAL LIGHT HANDLE (MISCELLANEOUS) ×4 IMPLANT
DRAIN PENROSE 1/4X12 LTX STRL (WOUND CARE) ×2 IMPLANT
ELECT COATED BLADE 2.86 ST (ELECTRODE) ×2 IMPLANT
ELECT REM PT RETURN 9FT ADLT (ELECTROSURGICAL) ×2
ELECTRODE REM PT RTRN 9FT ADLT (ELECTROSURGICAL) ×1 IMPLANT
GLOVE BIO SURGEON STRL SZ7.5 (GLOVE) ×2 IMPLANT
GLOVE ECLIPSE 6.5 STRL STRAW (GLOVE) ×2 IMPLANT
GLOVE EUDERMIC 7 POWDERFREE (GLOVE) ×2 IMPLANT
GOWN STRL NON-REIN LRG LVL3 (GOWN DISPOSABLE) ×4 IMPLANT
KIT BASIN OR (CUSTOM PROCEDURE TRAY) ×2 IMPLANT
KIT ROOM TURNOVER OR (KITS) ×2 IMPLANT
NEEDLE HYPO 25GX1X1/2 BEV (NEEDLE) ×2 IMPLANT
NS IRRIG 1000ML POUR BTL (IV SOLUTION) ×2 IMPLANT
PACK ENT DAY SURGERY (CUSTOM PROCEDURE TRAY) ×2 IMPLANT
PAD ARMBOARD 7.5X6 YLW CONV (MISCELLANEOUS) ×2 IMPLANT
PENCIL BUTTON HOLSTER BLD 10FT (ELECTRODE) ×2 IMPLANT
SUT ETHILON 2 0 FS 18 (SUTURE) ×2 IMPLANT
SWAB COLLECTION DEVICE MRSA (MISCELLANEOUS) ×2 IMPLANT
SYR BULB IRRIGATION 50ML (SYRINGE) ×2 IMPLANT
TAPE PAPER 2X10 WHT MICROPORE (GAUZE/BANDAGES/DRESSINGS) ×2 IMPLANT
TUBE ANAEROBIC SPECIMEN COL (MISCELLANEOUS) ×2 IMPLANT
YANKAUER SUCT BULB TIP NO VENT (SUCTIONS) ×2 IMPLANT

## 2012-06-11 NOTE — Brief Op Note (Signed)
06/10/2012 - 06/11/2012  11:10 AM  PATIENT:  [name]  27 y.o. male  PRE-OPERATIVE DIAGNOSIS:  Left Neck abscess  POST-OPERATIVE DIAGNOSIS:  Left Neck abscess  PROCEDURE:  Incision and Drainage left neck abscess  SURGEON:  Surgeon(s) and Role:    * Christia Reading, MD - Primary  PHYSICIAN ASSISTANT:   ASSISTANTS: none   ANESTHESIA:   general  EBL:     BLOOD ADMINISTERED:none  DRAINS: Penrose drain in the left neck   LOCAL MEDICATIONS USED:  LIDOCAINE   SPECIMEN:  Source of Specimen:  culture of left neck abscess  DISPOSITION OF SPECIMEN:  Microbiology  COUNTS:  YES  TOURNIQUET:  * No tourniquets in log *  DICTATION: .Other Dictation: Dictation Number [id number]  PLAN OF CARE: Return to hospital room  PATIENT DISPOSITION:  PACU - hemodynamically stable.   Delay start of Pharmacological VTE agent (>24hrs) due to surgical blood loss or risk of bleeding: no

## 2012-06-11 NOTE — Transfer of Care (Signed)
Immediate Anesthesia Transfer of Care Note  Patient: [name]  Procedure(s) Performed: Procedure(s) (LRB) with comments: INCISION AND DRAINAGE ABSCESS (N/A)  Patient Location: PACU  Anesthesia Type:General  Level of Consciousness: awake, alert  and oriented  Airway & Oxygen Therapy: Patient Spontanous Breathing and Patient connected to face mask oxygen  Post-op Assessment: Report given to PACU RN, Post -op Vital signs reviewed and stable and Patient moving all extremities  Post vital signs: Reviewed and stable  Complications: No apparent anesthesia complications

## 2012-06-11 NOTE — Anesthesia Postprocedure Evaluation (Signed)
Anesthesia Post Note  Patient: [name]  Procedure(s) Performed: Procedure(s) (LRB): INCISION AND DRAINAGE ABSCESS (N/A)  Anesthesia type: general  Patient location: PACU  Post pain: Pain level controlled  Post assessment: Patient's Cardiovascular Status Stable  Last Vitals:  Filed Vitals:   06/11/12 1130  BP: 112/49  Pulse: 76  Temp:   Resp: 13    Post vital signs: Reviewed and stable  Level of consciousness: sedated  Complications: No apparent anesthesia complications

## 2012-06-11 NOTE — Progress Notes (Signed)
[facility] PROGRESS NOTE  [name] OZH:[id number] DOB: September 06, 1985 DOA: 06/10/2012 PCP: Default, Provider, MD  HPI/Subjective: Feels much better, status post abscess drainage    Assessment/Plan:   Left neck abscess -Seen by ENT, Dr. Jenne Pane did incision and drainage of left neck abscess earlier today. -Less pain and pressure on his left side of his neck. -Is on Unasyn, continue antibiotics for now. -No evidence of respiratory compromise, patient able to swallow without problems.  Leukocytosis -Secondary to his abscess.  Code Status: Full code Family Communication: Discussed with the patient Disposition Plan: Remains inpatient, continue antibiotics   Consultants:  Christia Reading of ENT service  Procedures:  Incision and drainage of the left neck abscess done on 06/11/2012.  Antibiotics:  Unasyn was started on 20 12/02/2011.  Objective: Filed Vitals:   06/11/12 1145 06/11/12 1200 06/11/12 1229 06/11/12 1356  BP: 106/48 105/55 112/54 117/59  Pulse:   77 98  Temp:  98 F (36.7 C) 97.8 F (36.6 C) 98 F (36.7 C)  TempSrc:   Oral Oral  Resp:   15 16  Height:      Weight:      SpO2:   92% 97%    Intake/Output Summary (Last 24 hours) at 06/11/12 1435 Last data filed at 06/11/12 1415  Gross per 24 hour  Intake 1399.67 ml  Output     20 ml  Net 1379.67 ml   Filed Weights   06/10/12 0533  Weight: 57.698 kg (127 lb 3.2 oz)    Exam: General: Alert and awake, oriented x3, not in any acute distress. HEENT: anicteric sclera, pupils reactive to light and accommodation, EOMI CVS: S1-S2 clear, no murmur rubs or gallops Chest: clear to auscultation bilaterally, no wheezing, rales or rhonchi Abdomen: soft nontender, nondistended, normal bowel sounds, no organomegaly Extremities: no cyanosis, clubbing or edema noted bilaterally Neuro: Cranial nerves II-XII intact, no focal neurological deficits  Data Reviewed: Basic Metabolic Panel:  Lab 06/11/12 6295  06/10/12 0023 06/07/12 1533  NA 139 140 138  K 3.9 4.2 4.7  CL 104 102 100  CO2 27 30 27   GLUCOSE 101* 85 87  BUN 7 7 6   CREATININE 0.84 0.82 0.78  CALCIUM 9.0 9.5 9.5  MG -- -- --  PHOS -- -- --   Liver Function Tests:  Lab 06/10/12 0023 06/07/12 1533  AST 23 29  ALT 11 13  ALKPHOS 55 53  BILITOT 0.2* 0.3  PROT 7.4 7.8  ALBUMIN 4.0 4.5   No results found for this basename: LIPASE:5,AMYLASE:5 in the last 168 hours No results found for this basename: AMMONIA:5 in the last 168 hours CBC:  Lab 06/11/12 0550 06/10/12 0023 06/07/12 1533  WBC 12.3* 11.5* 11.8*  NEUTROABS -- 7.7 8.5*  HGB 13.7 13.9 16.1  HCT 39.7 40.4 45.0  MCV 91.7 91.8 91.8  PLT 139* 140* 160   Cardiac Enzymes: No results found for this basename: CKTOTAL:5,CKMB:5,CKMBINDEX:5,TROPONINI:5 in the last 168 hours BNP (last 3 results) No results found for this basename: PROBNP:3 in the last 8760 hours CBG: No results found for this basename: GLUCAP:5 in the last 168 hours  Recent Results (from the past 240 hour(s))  SURGICAL PCR SCREEN     Status: Normal   Collection Time   06/11/12  7:11 AM      Component Value Range Status Comment   MRSA, PCR NEGATIVE  NEGATIVE Final    Staphylococcus aureus NEGATIVE  NEGATIVE Final      Studies: No results  found.  Scheduled Meds:   . ampicillin-sulbactam (UNASYN) IV  3 g Intravenous Q6H  . sodium chloride  3 mL Intravenous Q12H   Continuous Infusions:   . lactated ringers 1,000 mL (06/11/12 1415)    Principal Problem:  *Mass of neck Active Problems:  Medically noncompliant    Time spent: 35 mintes    [facility]  [facility] Pager [phone]. If 8PM-8AM, please contact night-coverage at [email or web address], password [facility] 06/11/2012, 2:35 PM  LOS: 1 day

## 2012-06-11 NOTE — Anesthesia Preprocedure Evaluation (Addendum)
Anesthesia Evaluation  Patient identified by MRN, date of birth, ID band Patient awake    Reviewed: Allergy & Precautions, H&P , NPO status , Patient's Chart, lab work & pertinent test results, reviewed documented beta blocker date and time   Airway Mallampati: II TM Distance: >3 FB Neck ROM: full    Dental  (+) Poor Dentition, Dental Advisory Given and Loose Upper and lower teeth rotted at gumline.  :   Pulmonary neg pulmonary ROS, Current Smoker,  breath sounds clear to auscultation        Cardiovascular negative cardio ROS  Rhythm:regular     Neuro/Psych negative neurological ROS  negative psych ROS   GI/Hepatic negative GI ROS, Neg liver ROS,   Endo/Other  negative endocrine ROS  Renal/GU negative Renal ROS  negative genitourinary   Musculoskeletal negative musculoskeletal ROS (+)   Abdominal   Peds  Hematology negative hematology ROS (+)   Anesthesia Other Findings See surgeon's H&P   Reproductive/Obstetrics negative OB ROS                          Anesthesia Physical Anesthesia Plan  ASA: II  Anesthesia Plan: General   Post-op Pain Management:    Induction: Intravenous  Airway Management Planned: Oral ETT  Additional Equipment:   Intra-op Plan:   Post-operative Plan: Extubation in OR  Informed Consent: I have reviewed the patients History and Physical, chart, labs and discussed the procedure including the risks, benefits and alternatives for the proposed anesthesia with the patient or authorized representative who has indicated his/her understanding and acceptance.   Dental Advisory Given  Plan Discussed with: CRNA and Surgeon  Anesthesia Plan Comments:         Anesthesia Quick Evaluation

## 2012-06-11 NOTE — Progress Notes (Signed)
Day of Surgery  Subjective: Left neck feels sore, no better.  Otherwise, without complaint.  Objective: Vital signs in last 24 hours: Temp:  [97.5 F (36.4 C)-98.5 F (36.9 C)] 98.5 F (36.9 C) (01/28 6213) Pulse Rate:  [67-96] 96  (01/28 0614) Resp:  [16-18] 16  (01/28 0614) BP: (115-122)/(62-71) 115/62 mmHg (01/28 0614) SpO2:  [96 %-98 %] 98 % (01/28 0614) Last BM Date: 06/09/12  Intake/Output from previous day: 01/27 0701 - 01/28 0700 In: 3 [I.V.:3] Out: -  Intake/Output this shift:    General appearance: alert, cooperative and no distress Neck: left upper neck with prominent area of fluctuant swelling, tenderness, redness measures about 5 x 4 cm.  Lower lip movement symmetric.  Lab Results:   Basename 06/11/12 0550 06/10/12 0023  WBC 12.3* 11.5*  HGB 13.7 13.9  HCT 39.7 40.4  PLT 139* 140*   BMET  Basename 06/11/12 0550 06/10/12 0023  NA 139 140  K 3.9 4.2  CL 104 102  CO2 27 30  GLUCOSE 101* 85  BUN 7 7  CREATININE 0.84 0.82  CALCIUM 9.0 9.5   PT/INR No results found for this basename: LABPROT:2,INR:2 in the last 72 hours ABG No results found for this basename: PHART:2,PCO2:2,PO2:2,HCO3:2 in the last 72 hours  Studies/Results: No results found.  Anti-infectives: Anti-infectives     Start     Dose/Rate Route Frequency Ordered Stop   06/10/12 0800   Ampicillin-Sulbactam (UNASYN) 3 g in sodium chloride 0.9 % 100 mL IVPB        3 g 100 mL/hr over 60 Minutes Intravenous Every 6 hours 06/10/12 0534     06/10/12 0100   Ampicillin-Sulbactam (UNASYN) 3 g in sodium chloride 0.9 % 100 mL IVPB        3 g 100 mL/hr over 60 Minutes Intravenous  Once 06/10/12 0046 06/10/12 0257          Assessment/Plan: Left neck abscess versus infected cyst To OR today for I&D.  I do not feel that the antibiotic on its own is going to adequately control the infection, even if it is an infected cyst.  Risks, benefits, and alternatives were discussed.  LOS: 1 day     Yavier Snider 06/11/2012

## 2012-06-11 NOTE — Preoperative (Signed)
Beta Blockers   Reason not to administer Beta Blockers:Not Applicable 

## 2012-06-11 NOTE — Op Note (Signed)
NAMEMICHIAL, [name]             ACCOUNT NO.:  [id number]  MEDICAL RECORD NO.:  [id number]  LOCATION:  6N11C                        FACILITY:  [facility]  PHYSICIAN:  Antony Contras, MD     DATE OF BIRTH:  [birth year]  DATE OF PROCEDURE:  06/10/2012 DATE OF DISCHARGE:                              OPERATIVE REPORT   PREOPERATIVE DIAGNOSIS:  Left neck abscess.  POSTOPERATIVE DIAGNOSIS:  Left neck abscess.  PROCEDURE:  Incision and drainage of left neck abscess.  SURGEON:  Antony Contras, MD  ANESTHESIA:  General endotracheal anesthesia.  COMPLICATIONS:  None.  INDICATION:  The patient is a 27 year old white male who has a 3 year history of a soft mass in the left upper neck that has become inflamed and infected over the last week or so.  This did not respond to oral antibiotics, so he was admitted to the hospital on IV antibiotics.  In spite of that, his condition still has not improved, so he presents to the operating room for surgical management.  A pocket of purulent fluid was immediately encountered upon incising the skin and the fluid had cheesy material within it that was mostly yellow pus.  Cultures were taken.  DESCRIPTION OF PROCEDURE:  The patient was identified in the holding room and informed consent having been obtained including discussion of risks, benefits, alternatives, the patient was brought to the operative suite, put on the operative table in supine position.  Anesthesia was induced and the patient was intubated by anesthesia team without difficulty.  The patient had been receiving intravenous antibiotics in the hospital prior to surgery.  So no additional antibiotics were given. The left neck was prepped and draped in sterile fashion.  Incision area was injected with 1% lidocaine with 1:100,000 epinephrine.  A stab incision was made with a 15 blade scalpel through the skin into the abscess cavity directly.  Pus immediately flowed and cultures were taken.   The area was drained of pus by pressure and the cavity was then probed with a hemostat and then copiously irrigated with saline.  A 0.25- inch Penrose drain was placed, secured to the skin with 2-0 nylon suture.  The patient was cleaned off and a Kerlix fluff dressing was placed around the patient's neck.  He was returned to anesthesia for wake up, was extubated in the recovery room in stable condition.     Antony Contras, MD     DDB/MEDQ  D:  06/11/2012  T:  06/11/2012  Job:  [id number]

## 2012-06-11 NOTE — Progress Notes (Signed)
Subjective: POD#0 from incision and drainage of left neck abscess  Objective: Vital signs in last 24 hours: Temp:  [97.2 F (36.2 C)-98.5 F (36.9 C)] 98 F (36.7 C) (01/28 1356) Pulse Rate:  [75-98] 98  (01/28 1356) Resp:  [12-17] 16  (01/28 1356) BP: (105-122)/(48-71) 117/59 mmHg (01/28 1356) SpO2:  [92 %-100 %] 97 % (01/28 1356)  CN 2-12 intact and symmetric. Left dressing with mild serosanguinous drainage. Left neck with penrose in place and serosanguinous drainage and surrounding cellulitis/erythema  @LABLAST2 (wbc:2,hgb:2,hct:2,plt:2)  Basename 06/11/12 0550 06/10/12 0023  NA 139 140  K 3.9 4.2  CL 104 102  CO2 27 30  GLUCOSE 101* 85  BUN 7 7  CREATININE 0.84 0.82  CALCIUM 9.0 9.5    Medications:  Scheduled Meds:   . ampicillin-sulbactam (UNASYN) IV  3 g Intravenous Q6H  . sodium chloride  3 mL Intravenous Q12H   Continuous Infusions:   . lactated ringers 1,000 mL (06/11/12 1415)   PRN Meds:.sodium chloride, ibuprofen, sodium chloride  Assessment/Plan: POD#0 from left neck abscess incision and drainage. Change bandage as needed, adjust antibiotic coverage per cultures   LOS: 1 day   Melvenia Beam 06/11/2012, 4:39 PM

## 2012-06-12 ENCOUNTER — Encounter (HOSPITAL_COMMUNITY): Payer: Self-pay | Admitting: Otolaryngology

## 2012-06-12 DIAGNOSIS — L723 Sebaceous cyst: Secondary | ICD-10-CM | POA: Diagnosis present

## 2012-06-12 DIAGNOSIS — L089 Local infection of the skin and subcutaneous tissue, unspecified: Secondary | ICD-10-CM | POA: Diagnosis present

## 2012-06-12 NOTE — Progress Notes (Signed)
[facility] PROGRESS NOTE  Hughie Melroy ZOX:[id number] DOB: 06-03-1985 DOA: 06/10/2012 PCP: Default, Provider, MD  Assessment/Plan: Principal Problem:  *Mass of neck Active Problems:  Leukocytosis    Left neck abscess:   Patient presented with a soft mass at the left angle of the mandible, and CT and ultrasound on 06/07/12, suggested an infected cyst vs infected lymph node vs solid mass. He continued to have progressive symptoms, and represented on 06/10/12. He did have poor dentition including apical abscess of his left lower last molar. He was commenced on iv Unasyn, Dr. Christia Reading provided ENT consultation and performed I&D of left neck abscess on 06/11/12. No evidence of respiratory compromise, patient able to swallow without problems. Patient continues to improve clinically on Unasyn and Bactrim was added on 06/11/12, by ENT. Per ENT, this was an infected cyst, and will need eventual excision.    Code Status: Full Code.  Family Communication: Disposition Plan: To be determined. Per ENT.    Brief narrative: 27 yo healthy male came to ED over 2 days prior, with a small swelling under his left mandible was, was diagnosed with lymphadenitis, given a dose of iv Vancomycin/Rocephin, oral Augmentin and told to f/u with ENT as outpt. However due to cost issues he could not fill the Augmentin for 2 days and had only taken one dose of the Augmentin. Since that time, the size of this "swelling" has over doubled. There has been no draining from the area and there is increase in the redness around the area over the last 48 hours. Eating well. No n/v. No fevers, admitted for further management.   Consultants:  Dr Christia Reading, ENT.   Procedures:  N/A.   Antibiotics:  Unasyn 06/09/12>>>  Bactrim 06/11/12>>>  HPI/Subjective: Feels better.   Objective: Vital signs in last 24 hours: Temp:  [97.2 F (36.2 C)-98.3 F (36.8 C)] 98.3 F (36.8 C) (01/29 0532) Pulse Rate:  [70-98]  72  (01/29 0532) Resp:  [12-20] 20  (01/29 0532) BP: (105-117)/(48-64) 106/57 mmHg (01/29 0532) SpO2:  [92 %-100 %] 98 % (01/29 0532) Weight change:  Last BM Date: 06/09/12  Intake/Output from previous day: 01/28 0701 - 01/29 0700 In: 2862.5 [P.O.:720; I.V.:1842.5; IV Piggyback:300] Out: 23 [Urine:3; Blood:20]     Physical Exam: General: Comfortable, alert, communicative, fully oriented, not short of breath at rest.  HEENT:  No clinical pallor, no jaundice, no conjunctival injection or discharge. Hydration is fair.  NECK:  Dressed in bandaging and dressings. Unable to formally examine. . CHEST:  Clinically clear to auscultation, no wheezes, no crackles. HEART:  Sounds 1 and 2 heard, normal, regular, no murmurs. ABDOMEN:  Full, non-tender, no palpable organomegaly, no palpable masses, normal bowel sounds. GENITALIA:  Not examined. LOWER EXTREMITIES:  No pitting edema, palpable peripheral pulses. MUSCULOSKELETAL SYSTEM:  Unremarkable.  CENTRAL NERVOUS SYSTEM:  No focal neurologic deficit on gross examination.  Lab Results:  Basename 06/11/12 0550 06/10/12 0023  WBC 12.3* 11.5*  HGB 13.7 13.9  HCT 39.7 40.4  PLT 139* 140*    Basename 06/11/12 0550 06/10/12 0023  NA 139 140  K 3.9 4.2  CL 104 102  CO2 27 30  GLUCOSE 101* 85  BUN 7 7  CREATININE 0.84 0.82  CALCIUM 9.0 9.5   Recent Results (from the past 240 hour(s))  SURGICAL PCR SCREEN     Status: Normal   Collection Time   06/11/12  7:11 AM      Component Value Range Status Comment  MRSA, PCR NEGATIVE  NEGATIVE Final    Staphylococcus aureus NEGATIVE  NEGATIVE Final   CULTURE, ROUTINE-ABSCESS     Status: Normal (Preliminary result)   Collection Time   06/11/12 11:03 AM      Component Value Range Status Comment   Specimen Description ABSCESS NECK LEFT   Final    Special Requests NONE   Final    Gram Stain     Final    Value: MODERATE WBC PRESENT,BOTH PMN AND MONONUCLEAR     NO SQUAMOUS EPITHELIAL CELLS SEEN      NO ORGANISMS SEEN   Culture NO GROWTH 1 DAY   Final    Report Status PENDING   Incomplete      Studies/Results: No results found.  Medications: Scheduled Meds:   . ampicillin-sulbactam (UNASYN) IV  3 g Intravenous Q6H  . sodium chloride  3 mL Intravenous Q12H  . sulfamethoxazole-trimethoprim  1 tablet Oral Q12H   Continuous Infusions:   . lactated ringers 1,000 mL (06/11/12 1415)   PRN Meds:.sodium chloride, acetaminophen, ibuprofen, ondansetron (ZOFRAN) IV, sodium chloride    LOS: 2 days   Stephine Langbehn,CHRISTOPHER  [facility] Pager [phone]. If 8PM-8AM, please contact night-coverage at [email or web address], password [facility] 06/12/2012, 7:49 AM  LOS: 2 days

## 2012-06-12 NOTE — Progress Notes (Signed)
1 Day Post-Op  Subjective: Feeling fine.  Not sure if swelling has improved much.  Objective: Vital signs in last 24 hours: Temp:  [97.2 F (36.2 C)-98.3 F (36.8 C)] 98.3 F (36.8 C) (01/29 0532) Pulse Rate:  [70-98] 72  (01/29 0532) Resp:  [12-20] 20  (01/29 0532) BP: (105-117)/(48-64) 106/57 mmHg (01/29 0532) SpO2:  [92 %-100 %] 98 % (01/29 0532) Last BM Date: 06/09/12  Intake/Output from previous day: 01/28 0701 - 01/29 0700 In: 2862.5 [P.O.:720; I.V.:1842.5; IV Piggyback:300] Out: 23 [Urine:3; Blood:20] Intake/Output this shift:    General appearance: alert, cooperative and no distress Neck: Left neck incision with Penrose in place, fluid collection somewhat deflated, milked serosanguinous fluid with curdy material out of incision, surrounding edema and redness stable.  Lab Results:   Basename 06/11/12 0550 06/10/12 0023  WBC 12.3* 11.5*  HGB 13.7 13.9  HCT 39.7 40.4  PLT 139* 140*   BMET  Basename 06/11/12 0550 06/10/12 0023  NA 139 140  K 3.9 4.2  CL 104 102  CO2 27 30  GLUCOSE 101* 85  BUN 7 7  CREATININE 0.84 0.82  CALCIUM 9.0 9.5   PT/INR No results found for this basename: LABPROT:2,INR:2 in the last 72 hours ABG No results found for this basename: PHART:2,PCO2:2,PO2:2,HCO3:2 in the last 72 hours  Studies/Results: No results found.  Anti-infectives: Anti-infectives     Start     Dose/Rate Route Frequency Ordered Stop   06/11/12 2200  sulfamethoxazole-trimethoprim (BACTRIM DS) 800-160 MG per tablet 1 tablet       1 tablet Oral Every 12 hours 06/11/12 1714 06/25/12 2159   06/10/12 0800   Ampicillin-Sulbactam (UNASYN) 3 g in sodium chloride 0.9 % 100 mL IVPB        3 g 100 mL/hr over 60 Minutes Intravenous Every 6 hours 06/10/12 0534     06/10/12 0100   Ampicillin-Sulbactam (UNASYN) 3 g in sodium chloride 0.9 % 100 mL IVPB        3 g 100 mL/hr over 60 Minutes Intravenous  Once 06/10/12 0046 06/10/12 0257          Assessment/Plan: s/p  Procedure(s) (LRB) with comments: INCISION AND DRAINAGE ABSCESS (N/A) Doing well with Penrose drain in place.  Cultures pending.  Continue Unasyn IV until surrounding edema and redness improves then may discharge on oral antibiotic, depending on culture result.  Anticipate discharge in one or two days.  Drain will be removed with drainage decreases.  The infected area likely represents an infected cyst and ultimately, excision will likely be required.  LOS: 2 days    Swati Granberry 06/12/2012

## 2012-06-12 NOTE — Addendum Note (Signed)
Addendum  created 06/12/12 1100 by Adair Laundry, CRNA   Modules edited:Charges VN

## 2012-06-13 DIAGNOSIS — L723 Sebaceous cyst: Secondary | ICD-10-CM

## 2012-06-13 LAB — BASIC METABOLIC PANEL
CO2: 28 mEq/L (ref 19–32)
Calcium: 9.2 mg/dL (ref 8.4–10.5)
Creatinine, Ser: 0.97 mg/dL (ref 0.50–1.35)
GFR calc Af Amer: >90 mL/min (ref >90)
GFR calc non Af Amer: >90 mL/min (ref >90)
Sodium: 142 mEq/L (ref 135–145)

## 2012-06-13 LAB — CBC
MCH: 31.2 pg (ref 26.0–34.0)
MCV: 92.6 fL (ref 78.0–100.0)
Platelets: 143 10*3/uL — ABNORMAL LOW (ref 150–400)
RBC: 4.2 MIL/uL — ABNORMAL LOW (ref 4.22–5.81)
RDW: 13.5 % (ref 11.5–15.5)

## 2012-06-13 MED ORDER — AMOXICILLIN-POT CLAVULANATE 875-125 MG PO TABS
1.0000 | ORAL_TABLET | Freq: Two times a day (BID) | ORAL | Status: DC
  Filled 2012-06-13 (×2): qty 1

## 2012-06-13 MED ORDER — AMOXICILLIN-POT CLAVULANATE 875-125 MG PO TABS: 1.0000 | ORAL_TABLET | Freq: Two times a day (BID) | ORAL | Status: DC

## 2012-06-13 NOTE — Discharge Summary (Signed)
Physician Discharge Summary  [name] ZOX:[id number] DOB: [birth year] DOA: 06/10/2012  PCP: Default, Provider, MD  Admit date: 06/10/2012 Discharge date: 06/13/2012  Time spent: 40 minutes  Recommendations for Outpatient Follow-up:  1. Follow up with Dr Christia Reading, ENT surgeon.   Discharge Diagnoses:  Principal Problem:  *Mass of neck Active Problems:  Leukocytosis  Infected sebaceous cyst of skin   Discharge Condition: Satisfactory.  Diet recommendation: Regular.   Filed Weights   06/10/12 0533  Weight: 57.698 kg (127 lb 3.2 oz)    History of present illness:  27 yo healthy male came to ED over 2 days prior, with a small swelling under his left mandible was, was diagnosed with lymphadenitis, given a dose of iv Vancomycin/Rocephin, oral Augmentin and told to f/u with ENT as outpt. However due to cost issues he could not fill the Augmentin for 2 days and had only taken one dose of the Augmentin. Since that time, the size of this "swelling" has over doubled. There has been no draining from the area and there is increase in the redness around the area over the last 48 hours. Eating well. No n/v. No fevers, admitted for further management.   Hospital Course:  Left neck abscess:  Patient presented with a soft mass at the left angle of the mandible, and CT and ultrasound on 06/07/12, suggested an infected cyst vs infected lymph node vs solid mass. He continued to have progressive symptoms, and represented on 06/10/12. He did have poor dentition including apical abscess of his left lower last molar. He was commenced on iv Unasyn, Dr. Christia Reading provided ENT consultation and performed I&D of left neck abscess on 06/11/12. No evidence of respiratory compromise, patient able to swallow without problems. He continued to improve clinically on Unasyn and Bactrim was added on 06/11/12, by ENT. As of 06/13/12, no pyrexia had been recorded, wcc had normalized, and local inflammatory phenomena  had significantly subsided. Per ENT, this was an infected cyst, and will need eventual excision. Patient has been transitioned to oral Augumentin effective 06/13/12, fur a furthe 10 days of therapy, to be concluded on 06/22/12. He will follow up with Dr Jenne Pane on discharge.    Procedures:  See Below.   Consultations: Dr Christia Reading, ENT surgeon.     Discharge Exam: Filed Vitals:   06/12/12 1420 06/12/12 2151 06/13/12 0618 06/13/12 1020  BP: 120/61 118/60 108/63 112/61  Pulse: 70 69 62 65  Temp: 97.8 F (36.6 C) 97.8 F (36.6 C) 97.7 F (36.5 C) 97.5 F (36.4 C)  TempSrc: Oral Oral Oral Oral  Resp: 18 20 18 18   Height:      Weight:      SpO2: 98% 99% 96% 99%    General: Comfortable, alert, communicative, fully oriented, not short of breath at rest.  HEENT: No clinical pallor, no jaundice, no conjunctival injection or discharge. Hydration is fair.  NECK: Dressed in bandaging/dressings. Unable to formally examine. Please refer to Dr Jenne Pane' notes of 06/13/12, for his findings.  CHEST: Clinically clear to auscultation, no wheezes, no crackles.  HEART: Sounds 1 and 2 heard, normal, regular, no murmurs.  ABDOMEN: Full, non-tender, no palpable organomegaly, no palpable masses, normal bowel sounds.  GENITALIA: Not examined.  LOWER EXTREMITIES: No pitting edema, palpable peripheral pulses.  MUSCULOSKELETAL SYSTEM: Unremarkable.  CENTRAL NERVOUS SYSTEM: No focal neurologic deficit on gross examination.   Discharge Instructions      Discharge Orders    Future Orders Please Complete By Expires  Diet - low sodium heart healthy          Medication List     As of 06/13/2012 11:45 AM    TAKE these medications         amoxicillin-clavulanate 875-125 MG per tablet   Commonly known as: AUGMENTIN   Take 1 tablet by mouth every 12 (twelve) hours.      ibuprofen 200 MG tablet   Commonly known as: ADVIL,MOTRIN   Take 200-400 mg by mouth every 6 (six) hours as needed. For fever         Follow-up Information    Follow up with Jenne Pane, DWIGHT, MD. Schedule an appointment as soon as possible for a visit on 06/18/2012.   Contact information:   [address] [place] [address] [phone]           The results of significant diagnostics from this hospitalization (including imaging, microbiology, ancillary and laboratory) are listed below for reference.    Significant Diagnostic Studies: Ct Soft Tissue Neck W Contrast  06/07/2012  *RADIOLOGY REPORT*  Clinical Data: Knot on the left sided neck.  Hurts when turning.  CT NECK WITH CONTRAST  Technique:  Multidetector CT imaging of the neck was performed with intravenous contrast.  Contrast: 80mL OMNIPAQUE IOHEXOL 300 MG/ML  SOLN  Comparison: 06/07/2012 ultrasound.  Findings: Inferior to the left mandibular ramus - body junction there is a nonspecific 2.9 x 1.8 x 2.5 cm mass corresponds to the patient's palpable abnormality  and ultrasound abnormality.  This is of low density but heterogeneous and does not appear to be a simple cyst on the corresponding ultrasound.  Diffuse inflammation of surrounding fat planes.  Etiology of this is indeterminate. This may represent infected brachial cleft abnormality, infected lymph node or primary mass.  Increased number of normal size to minimally prominent sized lymph nodes throughout the neck otherwise noted possibly reactive origin.  Primary neck mass otherwise not identified.  Very minimal asymmetry palatine tonsils greater on the left.  Caries.  No evidence of jugular vein thrombosis.  Biapical bullae/blebs.  IMPRESSION: Inferior to the left mandibular ramus - body junction there is a nonspecific 2.9 x 1.8 x 2.5 cm mass corresponds to the patient's palpable abnormality  and ultrasound abnormality.  This is of low density but heterogeneous and does not appear to be a simple cyst on the corresponding ultrasound.  Diffuse inflammation of surrounding fat planes.  Etiology of this is  indeterminate.  This may represent infected brachial cleft abnormality, infected lymph node or primary mass.  Increased number of normal size to minimally prominent sized lymph nodes throughout the neck otherwise noted possibly reactive origin.  Primary neck mass otherwise not identified.  Very minimal asymmetry palatine tonsils greater on the left.  Caries.   Original Report Authenticated By: Lacy Duverney, M.D.    US Soft Tissue Head/neck  06/07/2012  *RADIOLOGY REPORT*  Clinical Data: Left neck mass.  ULTRASOUND OF HEAD/NECK SOFT TISSUES  Technique:  Ultrasound examination of the head and neck soft tissues was performed in the area of clinical concern.  Comparison:  None.  Findings: The patients palpable abnormality near the angle of the mandible in the submandibular region corresponds to a 2.4 x 1.3 x 2.8 cm mass.  This is likely a solid mass but could potentially be an infected or hemorrhagic cyst.  It is quite superficial and is most likely an enlarged and infected lymph node (lymphadenitis). This does not have typical sonographic features  of an abscess. Further evaluation with contrast enhanced CT scan of the neck would be helpful.  IMPRESSION:  2.4 x 1.3 x 2.8 cm superficial mass in the left submandibular region corresponding to the patient's palpable abnormality.  This is most likely lymphadenitis but could be a complex cyst or solid mass.  Further evaluation with contrast enhanced neck CT is suggested.   Original Report Authenticated By: Rudie Meyer, M.D.     Microbiology: Recent Results (from the past 240 hour(s))  SURGICAL PCR SCREEN     Status: Normal   Collection Time   06/11/12  7:11 AM      Component Value Range Status Comment   MRSA, PCR NEGATIVE  NEGATIVE Final    Staphylococcus aureus NEGATIVE  NEGATIVE Final   ANAEROBIC CULTURE     Status: Normal (Preliminary result)   Collection Time   06/11/12 11:03 AM      Component Value Range Status Comment   Specimen Description ABSCESS NECK  LEFT   Final    Special Requests NONE   Final    Gram Stain     Final    Value: MODERATE WBC PRESENT,BOTH PMN AND MONONUCLEAR     NO SQUAMOUS EPITHELIAL CELLS SEEN     NO ORGANISMS SEEN   Culture     Final    Value: NO ANAEROBES ISOLATED; CULTURE IN PROGRESS FOR 5 DAYS   Report Status PENDING   Incomplete   CULTURE, ROUTINE-ABSCESS     Status: Normal (Preliminary result)   Collection Time   06/11/12 11:03 AM      Component Value Range Status Comment   Specimen Description ABSCESS NECK LEFT   Final    Special Requests NONE   Final    Gram Stain     Final    Value: MODERATE WBC PRESENT,BOTH PMN AND MONONUCLEAR     NO SQUAMOUS EPITHELIAL CELLS SEEN     NO ORGANISMS SEEN   Culture NO GROWTH 2 DAYS   Final    Report Status PENDING   Incomplete      Labs: Basic Metabolic Panel:  Lab 06/13/12 0981 06/11/12 0550 06/10/12 0023 06/07/12 1533  NA 142 139 140 138  K 3.9 3.9 4.2 4.7  CL 104 104 102 100  CO2 28 27 30 27   GLUCOSE 96 101* 85 87  BUN 9 7 7 6   CREATININE 0.97 0.84 0.82 0.78  CALCIUM 9.2 9.0 9.5 9.5  MG -- -- -- --  PHOS -- -- -- --   Liver Function Tests:  Lab 06/10/12 0023 06/07/12 1533  AST 23 29  ALT 11 13  ALKPHOS 55 53  BILITOT 0.2* 0.3  PROT 7.4 7.8  ALBUMIN 4.0 4.5   No results found for this basename: LIPASE:5,AMYLASE:5 in the last 168 hours No results found for this basename: AMMONIA:5 in the last 168 hours CBC:  Lab 06/13/12 0502 06/11/12 0550 06/10/12 0023 06/07/12 1533  WBC 6.7 12.3* 11.5* 11.8*  NEUTROABS -- -- 7.7 8.5*  HGB 13.1 13.7 13.9 16.1  HCT 38.9* 39.7 40.4 45.0  MCV 92.6 91.7 91.8 91.8  PLT 143* 139* 140* 160   Cardiac Enzymes: No results found for this basename: CKTOTAL:5,CKMB:5,CKMBINDEX:5,TROPONINI:5 in the last 168 hours BNP: BNP (last 3 results) No results found for this basename: PROBNP:3 in the last 8760 hours CBG: No results found for this basename: GLUCAP:5 in the last 168 hours     Signed:  Duard Spiewak,CHRISTOPHER  [facility] 06/13/2012, 11:45 AM

## 2012-06-13 NOTE — Progress Notes (Signed)
2 Days Post-Op  Subjective: Feels that swelling has improved.  No complaints.  Objective: Vital signs in last 24 hours: Temp:  [97.7 F (36.5 C)-97.8 F (36.6 C)] 97.7 F (36.5 C) (01/30 0618) Pulse Rate:  [62-70] 62  (01/30 0618) Resp:  [18-20] 18  (01/30 0618) BP: (108-120)/(60-63) 108/63 mmHg (01/30 0618) SpO2:  [96 %-99 %] 96 % (01/30 0618) Last BM Date: 06/09/12  Intake/Output from previous day: 01/29 0701 - 01/30 0700 In: 2640.8 [P.O.:1440; I.V.:1200.8] Out: -  Intake/Output this shift:    General appearance: alert, cooperative and no distress Neck: Left neck wound with Penrose drain in place.  Fluid collected within cavity, expressed out pushing Penrose out.  Cheesy squamous material expressed.  Surrounding edema and redness improving.  Lab Results:   Basename 06/13/12 0502 06/11/12 0550  WBC 6.7 12.3*  HGB 13.1 13.7  HCT 38.9* 39.7  PLT 143* 139*   BMET  Basename 06/13/12 0502 06/11/12 0550  NA 142 139  K 3.9 3.9  CL 104 104  CO2 28 27  GLUCOSE 96 101*  BUN 9 7  CREATININE 0.97 0.84  CALCIUM 9.2 9.0   PT/INR No results found for this basename: LABPROT:2,INR:2 in the last 72 hours ABG No results found for this basename: PHART:2,PCO2:2,PO2:2,HCO3:2 in the last 72 hours  Studies/Results: No results found.  Anti-infectives: Anti-infectives     Start     Dose/Rate Route Frequency Ordered Stop   06/11/12 2200  sulfamethoxazole-trimethoprim (BACTRIM DS) 800-160 MG per tablet 1 tablet       1 tablet Oral Every 12 hours 06/11/12 1714 06/25/12 2159   06/10/12 0800   Ampicillin-Sulbactam (UNASYN) 3 g in sodium chloride 0.9 % 100 mL IVPB        3 g 100 mL/hr over 60 Minutes Intravenous Every 6 hours 06/10/12 0534     06/10/12 0100   Ampicillin-Sulbactam (UNASYN) 3 g in sodium chloride 0.9 % 100 mL IVPB        3 g 100 mL/hr over 60 Minutes Intravenous  Once 06/10/12 0046 06/10/12 0257          Assessment/Plan: s/p Procedure(s) (LRB) with  comments: INCISION AND DRAINAGE ABSCESS (N/A) Penrose drain is out.  Infection improving.  He can probably be sent home back on oral Augmentin.  He is comfortable milking the wound a couple of times per day and changing his dressing.  Culture is still pending.  He can follow-up with me early next week.  Will plan excision of cyst after infection treated.  LOS: 3 days    [name] 06/13/2012

## 2012-06-14 LAB — CULTURE, ROUTINE-ABSCESS: Culture: NO GROWTH

## 2012-06-15 DIAGNOSIS — L729 Follicular cyst of the skin and subcutaneous tissue, unspecified: Secondary | ICD-10-CM

## 2012-06-15 HISTORY — DX: Follicular cyst of the skin and subcutaneous tissue, unspecified: L72.9

## 2012-06-16 LAB — ANAEROBIC CULTURE

## 2012-07-01 ENCOUNTER — Encounter (HOSPITAL_BASED_OUTPATIENT_CLINIC_OR_DEPARTMENT_OTHER): Payer: Self-pay | Admitting: *Deleted

## 2012-07-01 DIAGNOSIS — R0989 Other specified symptoms and signs involving the circulatory and respiratory systems: Secondary | ICD-10-CM

## 2012-07-01 DIAGNOSIS — R059 Cough, unspecified: Secondary | ICD-10-CM

## 2012-07-01 DIAGNOSIS — T07XXXA Unspecified multiple injuries, initial encounter: Secondary | ICD-10-CM

## 2012-07-01 HISTORY — DX: Unspecified multiple injuries, initial encounter: T07.XXXA

## 2012-07-01 HISTORY — DX: Other specified symptoms and signs involving the circulatory and respiratory systems: R09.89

## 2012-07-01 HISTORY — DX: Cough, unspecified: R05.9

## 2012-07-02 ENCOUNTER — Encounter (HOSPITAL_BASED_OUTPATIENT_CLINIC_OR_DEPARTMENT_OTHER): Payer: Self-pay | Admitting: Anesthesiology

## 2012-07-02 ENCOUNTER — Ambulatory Visit (HOSPITAL_BASED_OUTPATIENT_CLINIC_OR_DEPARTMENT_OTHER)
Admission: RE | Admit: 2012-07-02 | Discharge: 2012-07-02 | Disposition: A | Discharge status: Home / Self Care | Payer: Medicaid Other | Source: Ambulatory Visit | Attending: Otolaryngology | Admitting: Otolaryngology

## 2012-07-02 ENCOUNTER — Ambulatory Visit (HOSPITAL_BASED_OUTPATIENT_CLINIC_OR_DEPARTMENT_OTHER): Payer: Medicaid Other | Admitting: Anesthesiology

## 2012-07-02 ENCOUNTER — Encounter (HOSPITAL_BASED_OUTPATIENT_CLINIC_OR_DEPARTMENT_OTHER)
Admission: RE | Disposition: A | Discharge status: Home / Self Care | Payer: Self-pay | Source: Ambulatory Visit | Attending: Otolaryngology

## 2012-07-02 DIAGNOSIS — L089 Local infection of the skin and subcutaneous tissue, unspecified: Secondary | ICD-10-CM

## 2012-07-02 DIAGNOSIS — L723 Sebaceous cyst: Secondary | ICD-10-CM | POA: Insufficient documentation

## 2012-07-02 HISTORY — DX: Unspecified multiple injuries, initial encounter: T07.XXXA

## 2012-07-02 HISTORY — DX: Cough: R05

## 2012-07-02 HISTORY — DX: Follicular cyst of the skin and subcutaneous tissue, unspecified: L72.9

## 2012-07-02 HISTORY — DX: Other specified symptoms and signs involving the circulatory and respiratory systems: R09.89

## 2012-07-02 HISTORY — DX: Unspecified convulsions: R56.9

## 2012-07-02 HISTORY — PX: SUBMAXILLARY CYST  EXCISION: SHX2458

## 2012-07-02 HISTORY — DX: Dental caries, unspecified: K02.9

## 2012-07-02 LAB — POCT HEMOGLOBIN-HEMACUE: Hemoglobin: 15.4 g/dL (ref 13.0–17.0)

## 2012-07-02 SURGERY — EXCISION SUBMAXILLARY CYST
Anesthesia: General | Site: Neck | Laterality: Left | Wound class: Dirty or Infected

## 2012-07-02 MED ORDER — DEXAMETHASONE SODIUM PHOSPHATE 4 MG/ML IJ SOLN
INTRAMUSCULAR | Status: DC | PRN
  Administered 2012-07-02: 10 mg via INTRAVENOUS

## 2012-07-02 MED ORDER — FENTANYL CITRATE 0.05 MG/ML IJ SOLN: 50.0000 ug | INTRAMUSCULAR | Status: DC | PRN

## 2012-07-02 MED ORDER — PROPOFOL 10 MG/ML IV BOLUS
INTRAVENOUS | Status: DC | PRN
  Administered 2012-07-02: 200 mg via INTRAVENOUS

## 2012-07-02 MED ORDER — LIDOCAINE HCL (CARDIAC) 20 MG/ML IV SOLN
INTRAVENOUS | Status: DC | PRN
  Administered 2012-07-02: 75 mg via INTRAVENOUS

## 2012-07-02 MED ORDER — HYDROCODONE-ACETAMINOPHEN 5-325 MG PO TABS: 1.0000 | ORAL_TABLET | ORAL | Status: DC | PRN

## 2012-07-02 MED ORDER — ONDANSETRON HCL 4 MG/2ML IJ SOLN: 4.0000 mg | Freq: Once | INTRAMUSCULAR | Status: DC | PRN

## 2012-07-02 MED ORDER — LACTATED RINGERS IV SOLN
INTRAVENOUS | Status: DC
  Administered 2012-07-02: 10:00:00 via INTRAVENOUS

## 2012-07-02 MED ORDER — HYDROMORPHONE HCL PF 1 MG/ML IJ SOLN: 0.2500 mg | INTRAMUSCULAR | Status: DC | PRN

## 2012-07-02 MED ORDER — MIDAZOLAM HCL 2 MG/ML PO SYRP: 12.0000 mg | ORAL_SOLUTION | Freq: Once | ORAL | Status: DC | PRN

## 2012-07-02 MED ORDER — MIDAZOLAM HCL 2 MG/2ML IJ SOLN: 1.0000 mg | INTRAMUSCULAR | Status: DC | PRN

## 2012-07-02 MED ORDER — OXYCODONE HCL 5 MG PO TABS: 5.0000 mg | ORAL_TABLET | Freq: Once | ORAL | Status: DC | PRN

## 2012-07-02 MED ORDER — 0.9 % SODIUM CHLORIDE (POUR BTL) OPTIME
TOPICAL | Status: DC | PRN
  Administered 2012-07-02: 200 mL

## 2012-07-02 MED ORDER — ONDANSETRON HCL 4 MG/2ML IJ SOLN: INTRAMUSCULAR | Status: DC | PRN

## 2012-07-02 MED ORDER — LIDOCAINE-EPINEPHRINE 1 %-1:100000 IJ SOLN
INTRAMUSCULAR | Status: DC | PRN
  Administered 2012-07-02: 3 mL

## 2012-07-02 MED ORDER — FENTANYL CITRATE 0.05 MG/ML IJ SOLN
INTRAMUSCULAR | Status: DC | PRN
  Administered 2012-07-02: 100 ug via INTRAVENOUS

## 2012-07-02 MED ORDER — ONDANSETRON HCL 4 MG/2ML IJ SOLN
INTRAMUSCULAR | Status: DC | PRN
  Administered 2012-07-02: 4 mg via INTRAVENOUS

## 2012-07-02 MED ORDER — OXYCODONE HCL 5 MG/5ML PO SOLN: 5.0000 mg | Freq: Once | ORAL | Status: DC | PRN

## 2012-07-02 MED ORDER — MIDAZOLAM HCL 5 MG/5ML IJ SOLN
INTRAMUSCULAR | Status: DC | PRN
  Administered 2012-07-02: 2 mg via INTRAVENOUS

## 2012-07-02 MED ORDER — CEFAZOLIN SODIUM-DEXTROSE 2-3 GM-% IV SOLR
INTRAVENOUS | Status: DC | PRN
  Administered 2012-07-02: 2 g via INTRAVENOUS

## 2012-07-02 MED ORDER — SUCCINYLCHOLINE CHLORIDE 20 MG/ML IJ SOLN
INTRAMUSCULAR | Status: DC | PRN
  Administered 2012-07-02: 40 mg via INTRAVENOUS

## 2012-07-02 SURGICAL SUPPLY — 63 items
ATTRACTOMAT 16X20 MAGNETIC DRP (DRAPES) IMPLANT
BENZOIN TINCTURE PRP APPL 2/3 (GAUZE/BANDAGES/DRESSINGS) IMPLANT
BLADE SURG 15 STRL LF DISP TIS (BLADE) ×1 IMPLANT
BLADE SURG 15 STRL SS (BLADE) ×1
CANISTER SUCTION 1200CC (MISCELLANEOUS) ×2 IMPLANT
CLEANER CAUTERY TIP 5X5 PAD (MISCELLANEOUS) IMPLANT
CLIP TI MEDIUM 6 (CLIP) IMPLANT
CLIP TI WIDE RED SMALL 6 (CLIP) IMPLANT
CLOTH BEACON ORANGE TIMEOUT ST (SAFETY) ×2 IMPLANT
CORDS BIPOLAR (ELECTRODE) IMPLANT
COVER MAYO STAND STRL (DRAPES) ×2 IMPLANT
COVER TABLE BACK 60X90 (DRAPES) ×2 IMPLANT
DERMABOND ADVANCED (GAUZE/BANDAGES/DRESSINGS) ×1
DERMABOND ADVANCED .7 DNX12 (GAUZE/BANDAGES/DRESSINGS) ×1 IMPLANT
DRAIN JACKSON RD 7FR 3/32 (WOUND CARE) IMPLANT
DRAIN PENROSE 1/4X12 LTX STRL (WOUND CARE) IMPLANT
DRAPE INCISE 23X17 IOBAN STRL (DRAPES)
DRAPE INCISE IOBAN 23X17 STRL (DRAPES) IMPLANT
DRAPE U-SHAPE 76X120 STRL (DRAPES) ×2 IMPLANT
ELECT COATED BLADE 2.86 ST (ELECTRODE) ×2 IMPLANT
ELECT PAIRED SUBDERMAL (MISCELLANEOUS)
ELECT REM PT RETURN 9FT ADLT (ELECTROSURGICAL) ×2
ELECTRODE PAIRED SUBDERMAL (MISCELLANEOUS) IMPLANT
ELECTRODE REM PT RTRN 9FT ADLT (ELECTROSURGICAL) ×1 IMPLANT
EVACUATOR SILICONE 100CC (DRAIN) IMPLANT
GAUZE SPONGE 4X4 12PLY STRL LF (GAUZE/BANDAGES/DRESSINGS) IMPLANT
GAUZE SPONGE 4X4 16PLY XRAY LF (GAUZE/BANDAGES/DRESSINGS) IMPLANT
GLOVE BIO SURGEON STRL SZ 6.5 (GLOVE) ×2 IMPLANT
GLOVE BIO SURGEON STRL SZ7.5 (GLOVE) ×2 IMPLANT
GLOVE BIOGEL PI IND STRL 7.0 (GLOVE) ×1 IMPLANT
GLOVE BIOGEL PI INDICATOR 7.0 (GLOVE) ×1
GLOVE ECLIPSE 6.5 STRL STRAW (GLOVE) ×2 IMPLANT
GOWN PREVENTION PLUS XLARGE (GOWN DISPOSABLE) ×6 IMPLANT
LOCATOR NERVE 3 VOLT (DISPOSABLE) IMPLANT
NEEDLE HYPO 25X1 1.5 SAFETY (NEEDLE) ×2 IMPLANT
NS IRRIG 1000ML POUR BTL (IV SOLUTION) ×2 IMPLANT
PACK BASIN DAY SURGERY FS (CUSTOM PROCEDURE TRAY) ×2 IMPLANT
PAD CLEANER CAUTERY TIP 5X5 (MISCELLANEOUS)
PENCIL FOOT CONTROL (ELECTRODE) ×2 IMPLANT
PROBE NERVBE PRASS .33 (MISCELLANEOUS) IMPLANT
RUBBERBAND STERILE (MISCELLANEOUS) IMPLANT
SHEET MEDIUM DRAPE 40X70 STRL (DRAPES) ×2 IMPLANT
SLEEVE SCD COMPRESS KNEE MED (MISCELLANEOUS) ×2 IMPLANT
SPONGE GAUZE 2X2 8PLY STRL LF (GAUZE/BANDAGES/DRESSINGS) IMPLANT
STAPLER VISISTAT 35W (STAPLE) IMPLANT
STRIP CLOSURE SKIN 1/2X4 (GAUZE/BANDAGES/DRESSINGS) IMPLANT
SUCTION FRAZIER TIP 10 FR DISP (SUCTIONS) IMPLANT
SUT CHROMIC 3 0 PS 2 (SUTURE) IMPLANT
SUT CHROMIC 4 0 P 3 18 (SUTURE) IMPLANT
SUT ETHILON 4 0 PS 2 18 (SUTURE) IMPLANT
SUT ETHILON 5 0 P 3 18 (SUTURE)
SUT NYLON ETHILON 5-0 P-3 1X18 (SUTURE) IMPLANT
SUT PLAIN 5 0 P 3 18 (SUTURE) IMPLANT
SUT SILK 4 0 TIES 17X18 (SUTURE) ×2 IMPLANT
SUT SURG 6 0 PRE1/P 10 (SUTURE) IMPLANT
SUT VIC AB 4-0 P-3 18XBRD (SUTURE) ×1 IMPLANT
SUT VIC AB 4-0 P3 18 (SUTURE) ×1
SUT VICRYL 4-0 PS2 18IN ABS (SUTURE) IMPLANT
SYR BULB 3OZ (MISCELLANEOUS) IMPLANT
SYR CONTROL 10ML LL (SYRINGE) ×2 IMPLANT
TRAY DSU PREP LF (CUSTOM PROCEDURE TRAY) ×2 IMPLANT
TUBE CONNECTING 20X1/4 (TUBING) ×2 IMPLANT
WATER STERILE IRR 1000ML POUR (IV SOLUTION) IMPLANT

## 2012-07-02 NOTE — Anesthesia Preprocedure Evaluation (Signed)
Anesthesia Evaluation  Patient identified by MRN, date of birth, ID band Patient awake    Reviewed: Allergy & Precautions, H&P , NPO status , Patient's Chart, lab work & pertinent test results  Airway Mallampati: I TM Distance: >3 FB Neck ROM: Full    Dental  (+) Teeth Intact and Dental Advisory Given   Pulmonary former smoker,  breath sounds clear to auscultation        Cardiovascular Rhythm:Regular     Neuro/Psych    GI/Hepatic   Endo/Other    Renal/GU      Musculoskeletal   Abdominal   Peds  Hematology   Anesthesia Other Findings   Reproductive/Obstetrics                           Anesthesia Physical Anesthesia Plan  ASA: I  Anesthesia Plan: General   Post-op Pain Management:    Induction: Intravenous  Airway Management Planned: Oral ETT  Additional Equipment:   Intra-op Plan:   Post-operative Plan: Extubation in OR  Informed Consent: I have reviewed the patients History and Physical, chart, labs and discussed the procedure including the risks, benefits and alternatives for the proposed anesthesia with the patient or authorized representative who has indicated his/her understanding and acceptance.   Dental advisory given  Plan Discussed with: CRNA, Anesthesiologist and Surgeon  Anesthesia Plan Comments:         Anesthesia Quick Evaluation

## 2012-07-02 NOTE — Anesthesia Procedure Notes (Signed)
Procedure Name: Intubation Date/Time: 07/02/2012 12:07 PM Performed by: Zenia Resides D Pre-anesthesia Checklist: Patient identified, Emergency Drugs available, Suction available and Patient being monitored Patient Re-evaluated:Patient Re-evaluated prior to inductionOxygen Delivery Method: Circle System Utilized Preoxygenation: Pre-oxygenation with 100% oxygen Intubation Type: IV induction Ventilation: Mask ventilation without difficulty Laryngoscope Size: Mac and 3 Grade View: Grade II Tube type: Oral Tube size: 8.0 mm Number of attempts: 1 Airway Equipment and Method: stylet and oral airway Placement Confirmation: ETT inserted through vocal cords under direct vision,  positive ETCO2 and breath sounds checked- equal and bilateral Secured at: 24 cm Tube secured with: Tape Dental Injury: Teeth and Oropharynx as per pre-operative assessment

## 2012-07-02 NOTE — Anesthesia Postprocedure Evaluation (Signed)
  Anesthesia Post-op Note  Patient: [name]  Procedure(s) Performed: Procedure(s): Excision of Left Neck Cyst (Left)  Patient Location: PACU  Anesthesia Type:General  Level of Consciousness: awake, alert  and oriented  Airway and Oxygen Therapy: Patient Spontanous Breathing  Post-op Pain: mild  Post-op Assessment: Post-op Vital signs reviewed  Post-op Vital Signs: Reviewed  Complications: No apparent anesthesia complications

## 2012-07-02 NOTE — H&P (Signed)
[name] is an 27 y.o. male.   Chief Complaint: left neck cyst HPI: 27 year old with three year history of a lump in the left upper neck.  About four weeks ago, the area swelled and became painful.  He required hospitalization and incision and drainage along with antibiotic therapy to control the infection.  It became clear that the lump represented a skin-related cyst.  He is doing well now.  The cyst has become very small and is not draining.  Past Medical History  Diagnosis Date  . Seizures as a teenager    x 1 - was never on anticonvulsant  . Cyst of skin 06/2012    left neck  . Cough 07/01/2012  . Runny nose 07/01/2012    clear drainage  . Teeth decayed   . Abrasions of multiple sites 07/01/2012    arms, face    Past Surgical History  Procedure Laterality Date  . Appendectomy    . Incision and drainage abscess  06/11/2012    Procedure: INCISION AND DRAINAGE ABSCESS;  Surgeon: Benjamin Reading, MD;  Location: [facility] OR;  Service: ENT;  Laterality: N/A;    History reviewed. No pertinent family history. Social History:  reports that he has been smoking Cigarettes.  He has a 9 pack-year smoking history. He has never used smokeless tobacco. He reports that he does not drink alcohol or use illicit drugs.  Allergies:  Allergies  Allergen Reactions  . Ultram (Tramadol Hcl) Swelling    TONGUE AND THROAT SWELLING    Medications Prior to Admission  Medication Sig Dispense Refill  . amoxicillin-clavulanate (AUGMENTIN) 875-125 MG per tablet Take 1 tablet by mouth every 12 (twelve) hours.  20 tablet  0  . ibuprofen (ADVIL,MOTRIN) 200 MG tablet Take 200-400 mg by mouth every 6 (six) hours as needed. For fever        Results for orders placed during the hospital encounter of 07/02/12 (from the past 48 hour(s))  POCT HEMOGLOBIN-HEMACUE     Status: None   Collection Time    07/02/12 10:26 AM      Result Value Range   Hemoglobin 15.4  13.0 - 17.0 g/dL   No results found.  Review of  Systems  HENT:       Left lower teeth hurting, radiating to the left ear.  All other systems reviewed and are negative.    Blood pressure 147/91, pulse 86, temperature 97.7 F (36.5 C), temperature source Oral, resp. rate 20, height 5\' 9"  (1.753 m), weight 56.881 kg (125 lb 6.4 oz), SpO2 99.00%. Physical Exam  Constitutional: He is oriented to person, place, and time. He appears well-developed and well-nourished. No distress.  HENT:  Head: Normocephalic and atraumatic.  Right Ear: External ear normal.  Left Ear: External ear normal.  Nose: Nose normal.  Mouth/Throat: Oropharynx is clear and moist.  Eyes: Conjunctivae and EOM are normal. Pupils are equal, round, and reactive to light.  Neck: Normal range of motion. Neck supple.  Cardiovascular: Normal rate.   Respiratory: Effort normal.  GI:  Did not examine.  Genitourinary:  Did not examine.  Musculoskeletal: Normal range of motion.  Neurological: He is alert and oriented to person, place, and time. No cranial nerve deficit.  Skin: Skin is warm and dry.  Psychiatric: He has a normal mood and affect. His behavior is normal. Judgment and thought content normal.     Assessment/Plan Left upper neck skin-related cyst To OR for excision of cyst.  [name]  07/02/2012, 11:10 AM

## 2012-07-02 NOTE — Brief Op Note (Signed)
07/02/2012  12:45 PM  PATIENT:  [name]  27 y.o. male  PRE-OPERATIVE DIAGNOSIS:  Left neck cyst  POST-OPERATIVE DIAGNOSIS:  Left neck cyst  PROCEDURE:  Procedure(s): Excision of Left Neck Cyst (Left)  SURGEON:  Surgeon(s) and Role:    * Christia Reading, MD - Primary  PHYSICIAN ASSISTANT: [place]adh  ASSISTANTS: none   ANESTHESIA:   general  EBL:  Total I/O In: 100 [I.V.:100] Out: -   BLOOD ADMINISTERED:none  DRAINS: none   LOCAL MEDICATIONS USED:  LIDOCAINE   SPECIMEN:  Source of Specimen:  left neck cyst  DISPOSITION OF SPECIMEN:  PATHOLOGY  COUNTS:  YES  TOURNIQUET:  * No tourniquets in log *  DICTATION: .Other Dictation: Dictation Number [id number]  PLAN OF CARE: Discharge to home after PACU  PATIENT DISPOSITION:  PACU - hemodynamically stable.   Delay start of Pharmacological VTE agent (>24hrs) due to surgical blood loss or risk of bleeding: no

## 2012-07-02 NOTE — Transfer of Care (Signed)
Immediate Anesthesia Transfer of Care Note  Patient: [name]  Procedure(s) Performed: Procedure(s): Excision of Left Neck Cyst (Left)  Patient Location: PACU  Anesthesia Type:General  Level of Consciousness: awake  Airway & Oxygen Therapy: Patient Spontanous Breathing and Patient connected to face mask oxygen  Post-op Assessment: Report given to PACU RN and Post -op Vital signs reviewed and stable  Post vital signs: Reviewed and stable  Complications: No apparent anesthesia complications

## 2012-07-03 ENCOUNTER — Encounter (HOSPITAL_BASED_OUTPATIENT_CLINIC_OR_DEPARTMENT_OTHER): Payer: Self-pay | Admitting: Otolaryngology

## 2012-07-03 NOTE — Op Note (Signed)
NAMEJERIMYAH, [name]             ACCOUNT NO.:  [id number]  MEDICAL RECORD NO.:  [id number]  LOCATION:                                 FACILITY:  PHYSICIAN:  Antony Contras, MD     DATE OF BIRTH:  01/25/1986  DATE OF PROCEDURE:  07/02/2012 DATE OF DISCHARGE:                              OPERATIVE REPORT   PREOPERATIVE DIAGNOSIS:  Left neck skin related cyst.  POSTOPERATIVE DIAGNOSIS:  Left neck skin related cyst.  PROCEDURE:  Excision of left neck, skin related cyst.  SURGEON:  Kiarrah Rausch D. Jenne Pane, MD  ASSISTANT:  Aquilla Hacker, PA-C  ANESTHESIA:  General endotracheal anesthesia.  COMPLICATIONS:  None.  INDICATION:  The patient is a 27 year old male who has a 3-year history of a lump in the left upper neck that became infected about 4 weeks ago and swelled and became painful.  He had to be hospitalized and put on intravenous antibiotics.  Without good response to the antibiotics, and incision and drainage was required in order to bring about resolution. He has done much better since then, and it became obvious that the infected area was a cyst with squamous debris being expressible from it. Now, after further antibiotic therapy, he presents to the operating for excision.  A small pit in the skin became more obvious after swelling came down following the drainage procedure and excising the cyst will include removing that pit as well as removing the incision.  FINDINGS:  The cyst was found to be just deep to the skin layer and superficial with platysma layer.  Excision included removing the pit as well as the incision and resulted in 2 separate incisions.  The cyst did rupture during removal.  DESCRIPTION OF PROCEDURE:  The patient was identified in the holding room.  Informed consent having been obtained, discussion of risks, benefits, alternatives, the patient was brought to the operative suite, placed in operating table in supine position.  Anesthesia was induced and  the patient was intubated by Anesthesia team without difficulty. The patient was given intravenous antibiotics during the case.  The eyes taped closed and the left neck was marked with marking pen with an elliptical incisions around the pit as well as around the incision scar. The areas were then injected with 1% lidocaine with 1:100,000 epinephrine.  The left neck was prepped and draped in sterile fashion. Incision was made around the skin pit using a 15-blade scalpel and extended through the subcutaneous tissues using the scalpel as well as Bovie electrocautery.  This was done dissecting the cystic structures from the overlying skin.  Next, the elliptical incision was made around the incision scar using the 15 blade scalpel as well.  Dissection was then continued with the Bovie electrocautery, extending underneath the skin bridge between the 2 incisions and dividing away the cystic structures from the overlying skin as well as from the underlying platysma muscle.  This continued until all of the abnormal tissue was removed and passed to nursing for pathology.  There was a very small air where the platysma muscle was penetrated otherwise the excision remained superficial to the platysma muscle.  At this point, the wound was copiously irrigated with  saline.  Bleeding was controlled in certain places using Bovie electrocautery.  The incisions were then closed first, were then closed with 4-0 Vicryl suture in a simple interrupted fashion in the subcutaneous layer.  It was first done in the incision around the pit and then in the incision around the scar excision.  The skin inferior to the scar excision was undermined using Bovie cautery to allow better closure.  The patient was then cleaned off and the incisions were then coated with Dermabond which was allowed to dry.  The patient was returned to anesthesia for wake-up and during wake up, the pressure was applied to the left neck.  The  patient was extubated and moved to recovery room in stable condition.     Antony Contras, MD     DDB/MEDQ  D:  07/02/2012  T:  07/02/2012  Job:  [id number]

## 2012-09-06 ENCOUNTER — Emergency Department (HOSPITAL_COMMUNITY)
Admission: EM | Admit: 2012-09-06 | Discharge: 2012-09-06 | Disposition: A | Discharge status: Home / Self Care | Payer: Medicaid Other | Attending: Emergency Medicine | Admitting: Emergency Medicine

## 2012-09-06 ENCOUNTER — Encounter (HOSPITAL_COMMUNITY): Payer: Self-pay | Admitting: *Deleted

## 2012-09-06 DIAGNOSIS — K047 Periapical abscess without sinus: Secondary | ICD-10-CM | POA: Insufficient documentation

## 2012-09-06 DIAGNOSIS — Z8669 Personal history of other diseases of the nervous system and sense organs: Secondary | ICD-10-CM | POA: Insufficient documentation

## 2012-09-06 DIAGNOSIS — Z87828 Personal history of other (healed) physical injury and trauma: Secondary | ICD-10-CM | POA: Insufficient documentation

## 2012-09-06 DIAGNOSIS — F17200 Nicotine dependence, unspecified, uncomplicated: Secondary | ICD-10-CM | POA: Insufficient documentation

## 2012-09-06 DIAGNOSIS — Z8719 Personal history of other diseases of the digestive system: Secondary | ICD-10-CM | POA: Insufficient documentation

## 2012-09-06 DIAGNOSIS — Z8709 Personal history of other diseases of the respiratory system: Secondary | ICD-10-CM | POA: Insufficient documentation

## 2012-09-06 DIAGNOSIS — K0889 Other specified disorders of teeth and supporting structures: Secondary | ICD-10-CM

## 2012-09-06 DIAGNOSIS — K089 Disorder of teeth and supporting structures, unspecified: Secondary | ICD-10-CM | POA: Insufficient documentation

## 2012-09-06 DIAGNOSIS — K029 Dental caries, unspecified: Secondary | ICD-10-CM | POA: Insufficient documentation

## 2012-09-06 DIAGNOSIS — Z872 Personal history of diseases of the skin and subcutaneous tissue: Secondary | ICD-10-CM | POA: Insufficient documentation

## 2012-09-06 MED ORDER — HYDROCODONE-ACETAMINOPHEN 5-325 MG PO TABS: 1.0000 | ORAL_TABLET | ORAL | Status: DC | PRN

## 2012-09-06 MED ORDER — PENICILLIN V POTASSIUM 500 MG PO TABS: 500.0000 mg | ORAL_TABLET | Freq: Four times a day (QID) | ORAL | Status: AC

## 2012-09-06 NOTE — ED Provider Notes (Signed)
Medical screening examination/treatment/procedure(s) were performed by non-physician practitioner and as supervising physician I was immediately available for consultation/collaboration.    Argenis Kumari D Kataleah Bejar, MD 09/06/12 2136 

## 2012-09-06 NOTE — ED Notes (Signed)
Pt to ED c/o swelling and pain to R upper gum, immediately to R of incisors.  Pt states swelling is diminished.

## 2012-09-06 NOTE — ED Provider Notes (Signed)
History     CSN: [id number]  Arrival date & time 09/06/12  1149   First MD Initiated Contact with Patient 09/06/12 1212      Chief Complaint  Patient presents with  . Dental Pain    (Consider location/radiation/quality/duration/timing/severity/associated sxs/prior treatment) HPI Comments: Patient presenting to the ED for dental pain. Patient states in the past couple of days he has had increased pain and swelling to his right upper teeth and gums. Pain exacerbated by chewing on the affected side. Patient has a history of dental abscesses, last one December 2013. Patient is not currently established with the dentist and has not seen one in several years.  Patient denies any numbness or paresthesias of face. Denies any fever, sweats, chills, or difficulty swallowing.  No trismus.  The history is provided by the patient.    Past Medical History  Diagnosis Date  . Seizures as a teenager    x 1 - was never on anticonvulsant  . Cyst of skin 06/2012    left neck  . Cough 07/01/2012  . Runny nose 07/01/2012    clear drainage  . Teeth decayed   . Abrasions of multiple sites 07/01/2012    arms, face    Past Surgical History  Procedure Laterality Date  . Appendectomy    . Incision and drainage abscess  06/11/2012    Procedure: INCISION AND DRAINAGE ABSCESS;  Surgeon: Christia Reading, MD;  Location: [facility] OR;  Service: ENT;  Laterality: N/A;  . Submaxillary cyst  excision Left 07/02/2012    Procedure: Excision of Left Neck Cyst;  Surgeon: Christia Reading, MD;  Location: [facility];  Service: ENT;  Laterality: Left;    No family history on file.  History  Substance Use Topics  . Smoking status: Current Every Day Smoker -- 1.00 packs/day for 9 years    Types: Cigarettes  . Smokeless tobacco: Never Used  . Alcohol Use: No      Review of Systems  HENT: Positive for dental problem.   All other systems reviewed and are negative.    Allergies  Ultram  Home Medications    Current Outpatient Rx  Name  Route  Sig  Dispense  Refill  . ibuprofen (ADVIL,MOTRIN) 200 MG tablet   Oral   Take 200-400 mg by mouth every 6 (six) hours as needed. For fever           BP 133/93  Pulse 90  Temp(Src) 97.2 F (36.2 C) (Oral)  Resp 20  SpO2 98%  Physical Exam  Nursing note and vitals reviewed. Constitutional: He is oriented to person, place, and time. He appears well-developed and well-nourished.  HENT:  Head: Normocephalic and atraumatic. No trismus in the jaw.  Mouth/Throat: Uvula is midline, oropharynx is clear and moist and mucous membranes are normal. Abnormal dentition. Dental caries present. No oropharyngeal exudate, posterior oropharyngeal edema, posterior oropharyngeal erythema or tonsillar abscesses.    Teeth largely in poor dentition, localized swelling, erythema, and gingival discoloration surrounding right upper incisor- concern for dental abscess; no trismus, handling secretions appropriately  Eyes: Conjunctivae and EOM are normal.  Neck: Normal range of motion. Neck supple.  Cardiovascular: Normal rate, regular rhythm and normal heart sounds.   Pulmonary/Chest: Effort normal and breath sounds normal.  Musculoskeletal: Normal range of motion.  Neurological: He is alert and oriented to person, place, and time.  Skin: Skin is warm and dry.  Psychiatric: He has a normal mood and affect.    ED  Course  Procedures (including critical care time)  Labs Reviewed - No data to display No results found.   1. Dental abscess   2. Pain, dental       MDM   Patient presenting to the ED for dental pain.  Teeth largely in poor dentition.  Pain localized to right upper incisor, surrounding swelling and erythema with discoloration of gingiva- concern for dental abscess. Rx pen VK, vicodin.  FU with dentist- Dr. Lucky Cowboy.  Discussed plan with pt- he agreed.  Return precautions advised.         Garlon Hatchet, PA-C 09/06/12 [phone]

## 2012-11-04 ENCOUNTER — Emergency Department (HOSPITAL_COMMUNITY)
Admission: EM | Admit: 2012-11-04 | Discharge: 2012-11-04 | Disposition: A | Discharge status: Home / Self Care | Payer: Medicaid Other | Attending: Emergency Medicine | Admitting: Emergency Medicine

## 2012-11-04 ENCOUNTER — Encounter (HOSPITAL_COMMUNITY): Payer: Self-pay | Admitting: *Deleted

## 2012-11-04 DIAGNOSIS — R059 Cough, unspecified: Secondary | ICD-10-CM | POA: Insufficient documentation

## 2012-11-04 DIAGNOSIS — K089 Disorder of teeth and supporting structures, unspecified: Secondary | ICD-10-CM | POA: Insufficient documentation

## 2012-11-04 DIAGNOSIS — Z8669 Personal history of other diseases of the nervous system and sense organs: Secondary | ICD-10-CM | POA: Insufficient documentation

## 2012-11-04 DIAGNOSIS — Z8709 Personal history of other diseases of the respiratory system: Secondary | ICD-10-CM | POA: Insufficient documentation

## 2012-11-04 DIAGNOSIS — K0889 Other specified disorders of teeth and supporting structures: Secondary | ICD-10-CM

## 2012-11-04 DIAGNOSIS — K047 Periapical abscess without sinus: Secondary | ICD-10-CM | POA: Insufficient documentation

## 2012-11-04 DIAGNOSIS — R509 Fever, unspecified: Secondary | ICD-10-CM | POA: Insufficient documentation

## 2012-11-04 DIAGNOSIS — R220 Localized swelling, mass and lump, head: Secondary | ICD-10-CM | POA: Insufficient documentation

## 2012-11-04 DIAGNOSIS — F17200 Nicotine dependence, unspecified, uncomplicated: Secondary | ICD-10-CM | POA: Insufficient documentation

## 2012-11-04 DIAGNOSIS — Z872 Personal history of diseases of the skin and subcutaneous tissue: Secondary | ICD-10-CM | POA: Insufficient documentation

## 2012-11-04 DIAGNOSIS — Z8719 Personal history of other diseases of the digestive system: Secondary | ICD-10-CM | POA: Insufficient documentation

## 2012-11-04 DIAGNOSIS — R05 Cough: Secondary | ICD-10-CM | POA: Insufficient documentation

## 2012-11-04 DIAGNOSIS — Z87828 Personal history of other (healed) physical injury and trauma: Secondary | ICD-10-CM | POA: Insufficient documentation

## 2012-11-04 MED ORDER — CLINDAMYCIN HCL 150 MG PO CAPS: ORAL_CAPSULE | ORAL | Status: DC

## 2012-11-04 MED ORDER — CLINDAMYCIN HCL 150 MG PO CAPS
450.0000 mg | ORAL_CAPSULE | Freq: Once | ORAL | Status: AC
  Administered 2012-11-04: 450 mg via ORAL
  Filled 2012-11-04: qty 1

## 2012-11-04 MED ORDER — NAPROXEN 500 MG PO TABS: 500.0000 mg | ORAL_TABLET | Freq: Two times a day (BID) | ORAL | Status: DC

## 2012-11-04 NOTE — ED Notes (Signed)
Pt is here with worsening dental abscess going up to right nasal area and reports never got dental work done

## 2012-11-04 NOTE — ED Provider Notes (Signed)
History    CSN: [id number] Arrival date & time 11/04/12  1502  First MD Initiated Contact with Patient 11/04/12 1656     Chief Complaint  Patient presents with  . Abscess   (Consider location/radiation/quality/duration/timing/severity/associated sxs/prior Treatment) The history is provided by the patient. No language interpreter was used.   [name] is a 27 y/o M with PMHx of seizures, cyst of skin, I&D of left mandibular abscess by Dr. Jenne Pane 06/11/2012, Submaxillary cyst removal 07/02/2012 by Dr. Jenne Pane, presenting to the ED with abscess to the right maxillary region that has been ongoing since 08/2012 - patient was seen in ED for dental pain and abscess in 08/2012 was discharged with antibiotics and follow-up with dentist - patient reported never following up due to insurance issues. Patient reported that the pain is localized to the right maxillary region, described as a sharp pain with mild radiation to the right ear that is intermittent - stated that he has experienced facial swelling that has been ongoing for the past 2 days, mainly to the right cheek, and stated that he has noticed pus and blood drainage from the abscess. Reported that he has been using Benadryl and Goodys pain powder with little relief for pain. Stated that his temperature was taken on Wednesday of last week, reported to be close to 100 degrees. Denied oaub with chewing, difficulty swallowing, pain with swallowing, visual disturbances, neck pain, neck stiffness, nasal congestion, chest pain, shortness of breath, difficulty breathing.  PCP none  Past Medical History  Diagnosis Date  . Seizures as a teenager    x 1 - was never on anticonvulsant  . Cyst of skin 06/2012    left neck  . Cough 07/01/2012  . Runny nose 07/01/2012    clear drainage  . Teeth decayed   . Abrasions of multiple sites 07/01/2012    arms, face   Past Surgical History  Procedure Laterality Date  . Appendectomy    . Incision and drainage  abscess  06/11/2012    Procedure: INCISION AND DRAINAGE ABSCESS;  Surgeon: Christia Reading, MD;  Location: [facility] OR;  Service: ENT;  Laterality: N/A;  . Submaxillary cyst  excision Left 07/02/2012    Procedure: Excision of Left Neck Cyst;  Surgeon: Christia Reading, MD;  Location: [facility];  Service: ENT;  Laterality: Left;   No family history on file. History  Substance Use Topics  . Smoking status: Current Every Day Smoker -- 1.00 packs/day for 9 years    Types: Cigarettes  . Smokeless tobacco: Never Used  . Alcohol Use: No    Review of Systems  Constitutional: Positive for fever. Negative for chills.  HENT: Positive for facial swelling (right sided). Negative for congestion, sore throat, trouble swallowing, neck pain and neck stiffness.   Eyes: Negative for photophobia and visual disturbance.  Respiratory: Positive for cough (baseline for patient). Negative for chest tightness and shortness of breath.   Gastrointestinal: Negative for nausea, vomiting, abdominal pain and diarrhea.  Neurological: Negative for dizziness, weakness, light-headedness and headaches.  All other systems reviewed and are negative.    Allergies  Ultram  Home Medications   Current Outpatient Rx  Name  Route  Sig  Dispense  Refill  . Aspirin-Acetaminophen (GOODYS BODY PAIN PO)   Oral   Take 1 packet by mouth daily as needed (pain).         . clindamycin (CLEOCIN) 150 MG capsule      Take 3 tablets PO TID  x 10 days   90 capsule   0   . naproxen (NAPROSYN) 500 MG tablet   Oral   Take 1 tablet (500 mg total) by mouth 2 (two) times daily.   30 tablet   0    BP 129/74  Pulse 85  Temp(Src) 98.3 F (36.8 C) (Oral)  Resp 18  SpO2 96% Physical Exam  Nursing note and vitals reviewed. Constitutional: He is oriented to person, place, and time. He appears well-developed and well-nourished. No distress.  HENT:  Head: Normocephalic and atraumatic.  Mouth/Throat: Oropharynx is clear and  moist. No oropharyngeal exudate.  Facial swelling noted to the right maxillary region - negative erythema, inflammation noted - pain upon palpation to the right maxillary region  Mouth: Poor dentition noted - darkened teeth, numerous cracked maxillary and mandibular teeth on both sides, tooth decay to the gumline noted. Right upper maxillary region swelling noted, negative drainage of pus noted. Negative erythema, inflammation noted to buccal mucosa. Uvula midline, symmetrical elevation.     Eyes: Conjunctivae and EOM are normal. Pupils are equal, round, and reactive to light. Right eye exhibits no discharge. Left eye exhibits no discharge.  Neck: Normal range of motion. Neck supple.  Left posterior aspect of neck - mobile, soft, non-tender cyst noted, suspicion to be lipoma.  Negative neck stiffness Negative nuchal rigidity Negative lymphadenopathy   Cardiovascular: Normal rate, regular rhythm and normal heart sounds.  Exam reveals no friction rub.   No murmur heard. Pulmonary/Chest: Effort normal and breath sounds normal. No respiratory distress. He has no wheezes. He has no rales.  Lymphadenopathy:    He has no cervical adenopathy.  Neurological: He is alert and oriented to person, place, and time. No cranial nerve deficit. He exhibits normal muscle tone. Coordination normal.  Cranial nerves III-XII grossly intact  Skin: Skin is warm and dry. No rash noted. He is not diaphoretic. No erythema.  Psychiatric: He has a normal mood and affect. His behavior is normal. Thought content normal.    ED Course  Procedures (including critical care time) Labs Reviewed - No data to display No results found.  1. Dental abscess   2. Pain, dental     MDM  Patient presenting to the ED with dental abscess and discomfort that has been ongoing since April 2014 - patient has not been seen by dentist or oral surgeon. Patient stable, afebrile. Discussed case with Dr. Leeann Must - Dr. Leeann Must saw and  assessed patient - did not recommend imaging at this moment, recommended antibiotic therapy and referral to oral surgeon. Small dose of pain medications given - discussed with patient the course, precautions and disposal technique. Discharged patient with Clindamycin. Referral to [facility], Dentist and oral surgeon. Discussed with patient to use warm compressions. Discussed with patient to continue to monitor symptoms and if symptoms are to worsen or change to report back to the ED - strict return instructions given.  Patient agreed to plan of care, understood all questions answered.   Raymon Mutton, PA-C 11/04/12 2133

## 2012-11-06 NOTE — ED Provider Notes (Signed)
Medical screening examination/treatment/procedure(s) were conducted as a shared visit with non-physician practitioner(s) and myself.  I personally evaluated the patient during the encounter Pt with suspected right upper dental abscess in region tooth 5-7. No facial cellulitis. No neck pain or swelling. Afeb. abx and close oral surgery/dental follow up.   Suzi Roots, MD 11/06/12 [phone]

## 2014-02-09 IMAGING — CT CT NECK W/ CM
4 series · 16 of 33 positions shown, 19 images · IV contrast (omnipaque)
Comparison: 06/07/2012 ultrasound.

CLINICAL DATA: Knot on the left sided neck.  Hurts when turning.

CT NECK WITH CONTRAST
TECHNIQUE: Multidetector CT imaging of the neck was performed with
intravenous contrast.
Contrast: 80mL OMNIPAQUE IOHEXOL 300 MG/ML  SOLN

[Series 2: 2cc/30ml and 1cc/45ml · axial · 0.49mm/px · z∈[-259,-206]mm · 2 of 106 slices shown]
[im 22/106  bone]
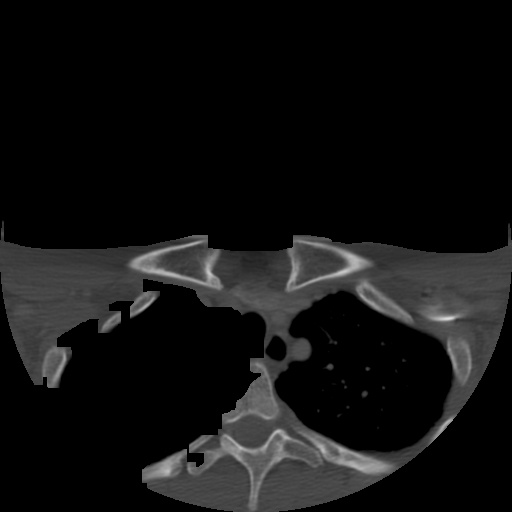
[im 43/106  bone]
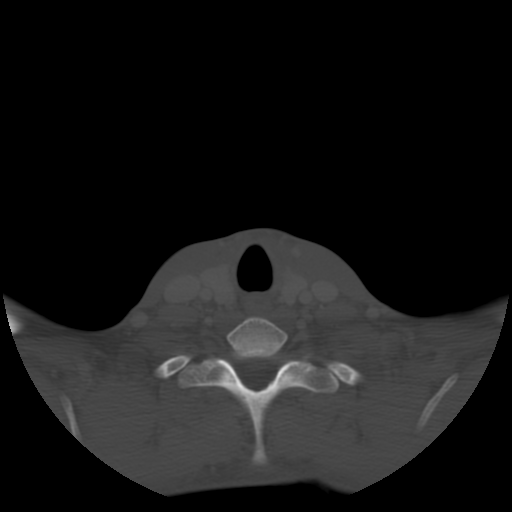

[Series 300: sag · sagittal · 0.53mm/px · 5 of 73 slices shown, 6 images]
[im 25/73  bone]
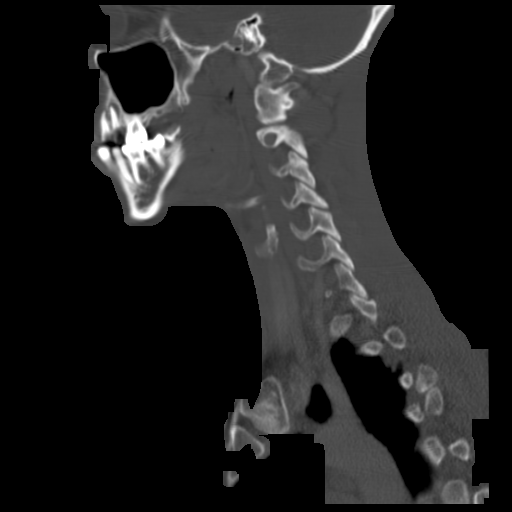
[im 31/73  bone]
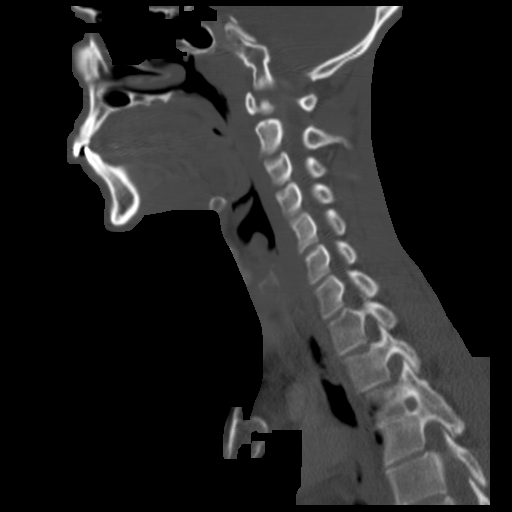
[im 37/73  soft-tissue]
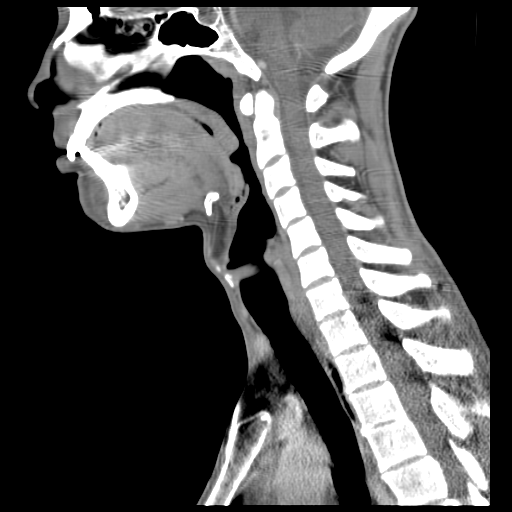
[im 37/73  bone]
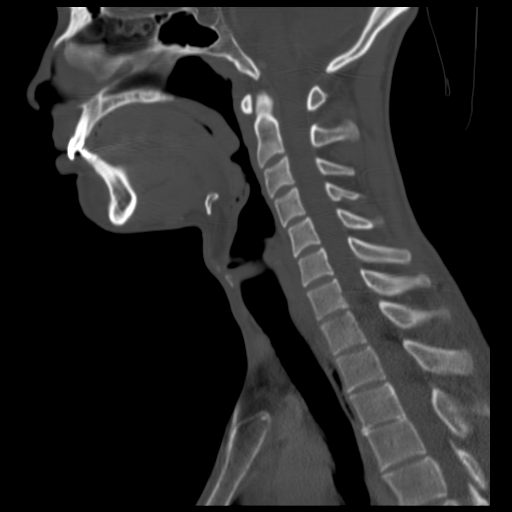
[im 43/73  bone]
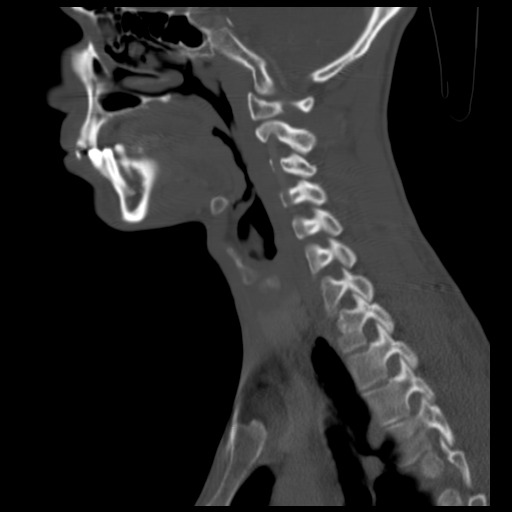
[im 49/73  bone]
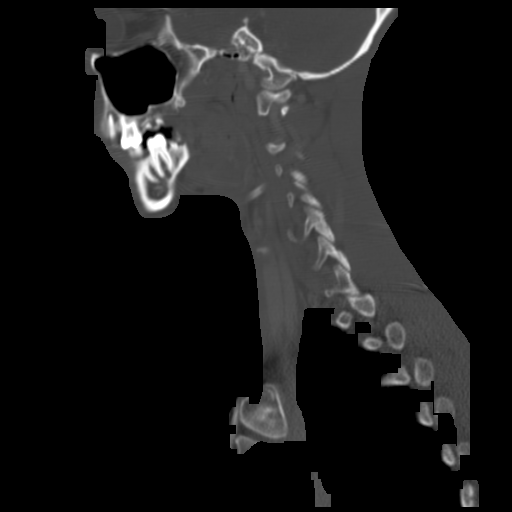

[Series 301: coronal · coronal · 0.53mm/px · 3 of 103 slices shown]
[im 28/103  bone]
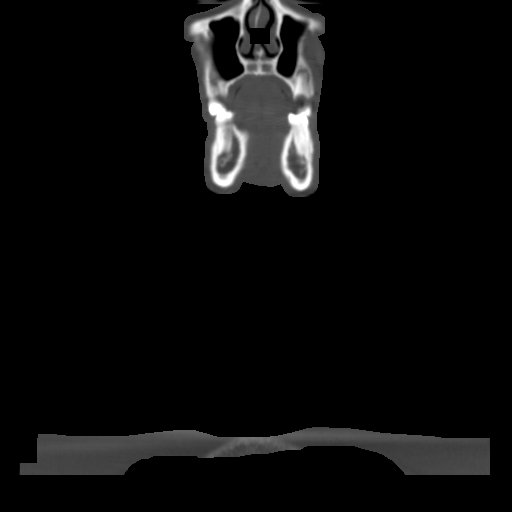
[im 44/103  bone]
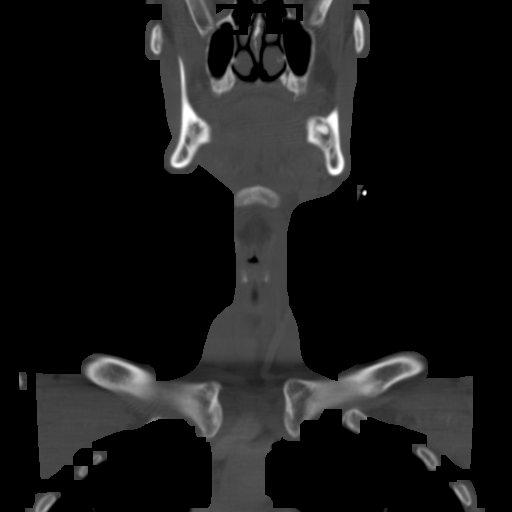
[im 60/103  bone]
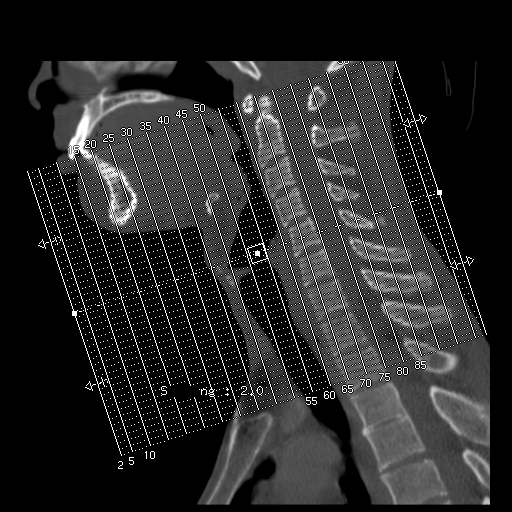

[Series 302: orthog · axial · 0.53mm/px · z∈[-303,-118]mm · 6 of 133 slices shown, 8 images]
[im 19/133  soft-tissue]
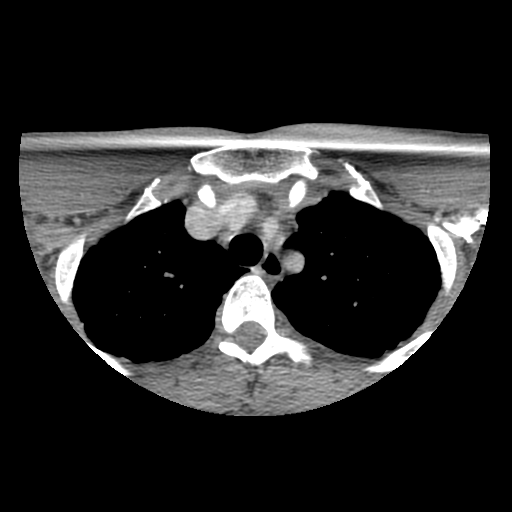
[im 19/133  bone]
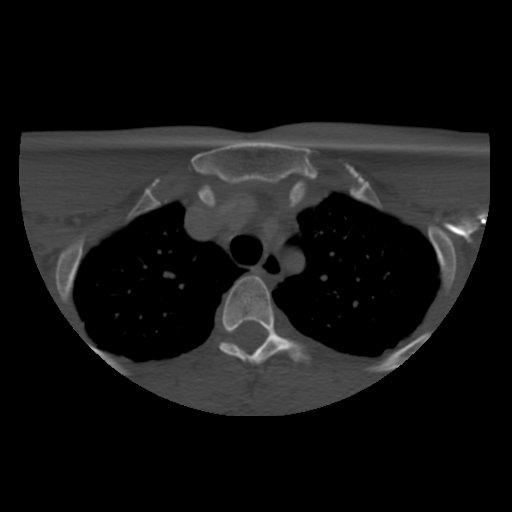
[im 38/133  bone]
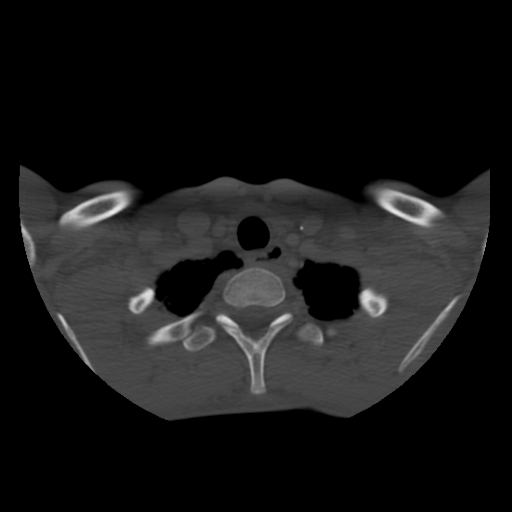
[im 57/133  bone]
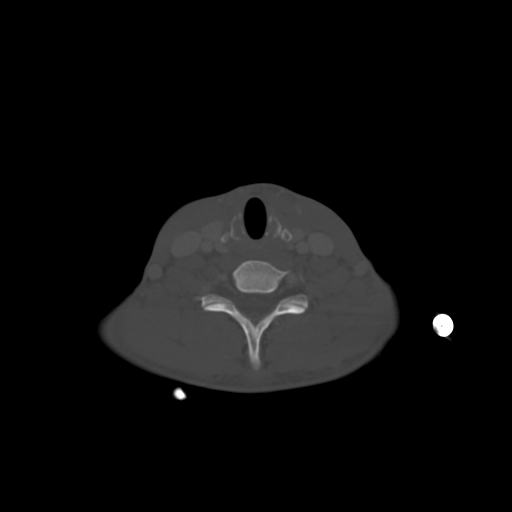
[im 76/133  bone]
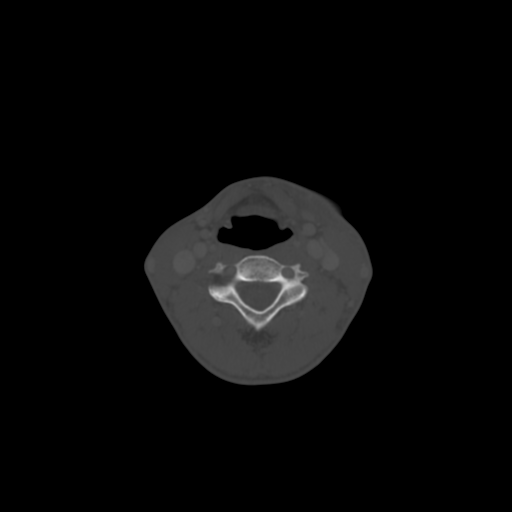
[im 95/133  soft-tissue]
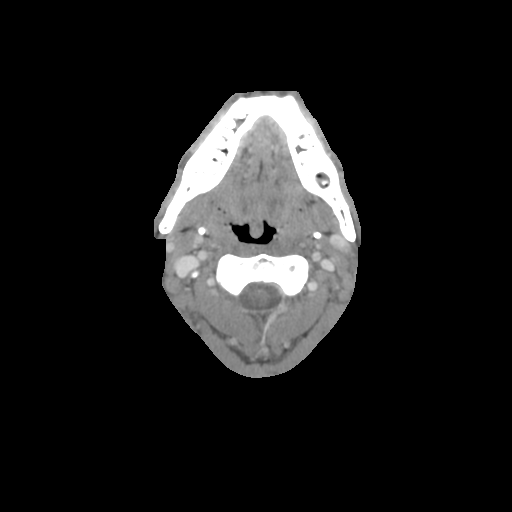
[im 95/133  bone]
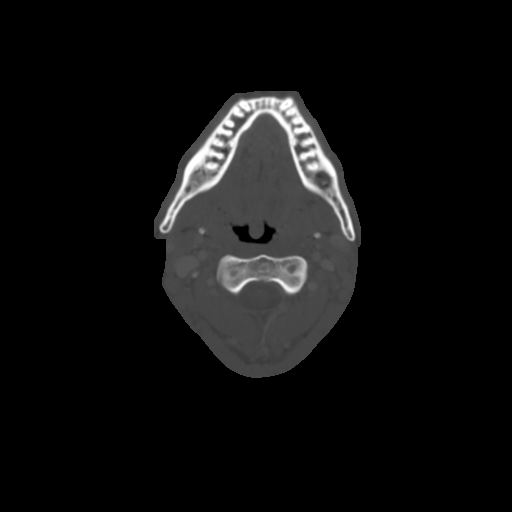
[im 114/133  bone]
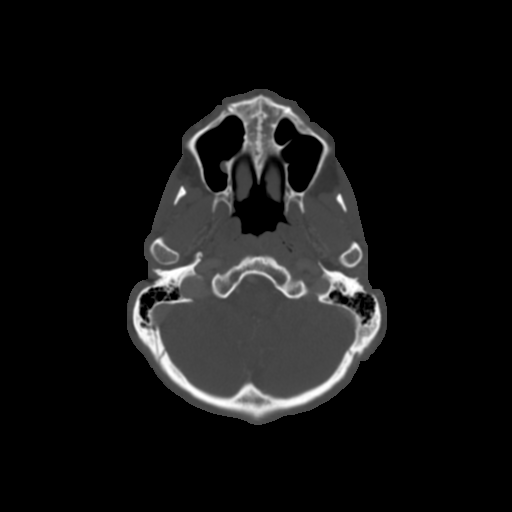

[16 of 33 positions shown; findings below may reference images not displayed]

FINDINGS: Inferior to the left mandibular ramus - body junction
there is a nonspecific 2.9 x 1.8 x 2.5 cm mass corresponds to the
patient's palpable abnormality  and ultrasound abnormality.  This
is of low density but heterogeneous and does not appear to be a
simple cyst on the corresponding ultrasound.  Diffuse inflammation
of surrounding fat planes.  Etiology of this is indeterminate.
This may represent infected brachial cleft abnormality, infected
lymph node or primary mass.

Increased number of normal size to minimally prominent sized lymph
nodes throughout the neck otherwise noted possibly reactive origin.

Primary neck mass otherwise not identified.  Very minimal asymmetry
palatine tonsils greater on the left.

Caries.

No evidence of jugular vein thrombosis.

Biapical bullae/blebs.
IMPRESSION: Inferior to the left mandibular ramus - body junction there is a
nonspecific 2.9 x 1.8 x 2.5 cm mass corresponds to the patient's
palpable abnormality  and ultrasound abnormality.  This is of low
density but heterogeneous and does not appear to be a simple cyst
on the corresponding ultrasound.  Diffuse inflammation of
surrounding fat planes.  Etiology of this is indeterminate.  This
may represent infected brachial cleft abnormality, infected lymph
node or primary mass.

Increased number of normal size to minimally prominent sized lymph
nodes throughout the neck otherwise noted possibly reactive origin.

Primary neck mass otherwise not identified.  Very minimal asymmetry
palatine tonsils greater on the left.

Caries.

## 2015-11-01 ENCOUNTER — Emergency Department (HOSPITAL_COMMUNITY): Payer: Medicaid Other

## 2015-11-01 ENCOUNTER — Encounter (HOSPITAL_COMMUNITY): Payer: Self-pay | Admitting: *Deleted

## 2015-11-01 ENCOUNTER — Emergency Department (HOSPITAL_COMMUNITY)
Admission: EM | Admit: 2015-11-01 | Discharge: 2015-11-01 | Disposition: A | Discharge status: Home / Self Care | Payer: Medicaid Other | Attending: Emergency Medicine | Admitting: Emergency Medicine

## 2015-11-01 DIAGNOSIS — Y929 Unspecified place or not applicable: Secondary | ICD-10-CM | POA: Diagnosis not present

## 2015-11-01 DIAGNOSIS — S6991XA Unspecified injury of right wrist, hand and finger(s), initial encounter: Secondary | ICD-10-CM | POA: Diagnosis present

## 2015-11-01 DIAGNOSIS — Y990 Civilian activity done for income or pay: Secondary | ICD-10-CM | POA: Insufficient documentation

## 2015-11-01 DIAGNOSIS — S62350A Nondisplaced fracture of shaft of second metacarpal bone, right hand, initial encounter for closed fracture: Secondary | ICD-10-CM | POA: Diagnosis not present

## 2015-11-01 DIAGNOSIS — F17210 Nicotine dependence, cigarettes, uncomplicated: Secondary | ICD-10-CM | POA: Insufficient documentation

## 2015-11-01 DIAGNOSIS — W228XXA Striking against or struck by other objects, initial encounter: Secondary | ICD-10-CM | POA: Insufficient documentation

## 2015-11-01 DIAGNOSIS — Y939 Activity, unspecified: Secondary | ICD-10-CM | POA: Diagnosis not present

## 2015-11-01 DIAGNOSIS — S62308A Unspecified fracture of other metacarpal bone, initial encounter for closed fracture: Secondary | ICD-10-CM

## 2015-11-01 HISTORY — DX: Unspecified appendicitis: K37

## 2015-11-01 MED ORDER — HYDROCODONE-ACETAMINOPHEN 5-325 MG PO TABS: 1.0000 | ORAL_TABLET | Freq: Four times a day (QID) | ORAL | Status: DC | PRN

## 2015-11-01 MED ORDER — HYDROCODONE-ACETAMINOPHEN 5-325 MG PO TABS
1.0000 | ORAL_TABLET | Freq: Once | ORAL | Status: AC
  Administered 2015-11-01: 1 via ORAL
  Filled 2015-11-01: qty 1

## 2015-11-01 NOTE — ED Provider Notes (Signed)
CSN: [id number]     Arrival date & time 11/01/15  1955 History   By signing my name below, I, [name], attest that this documentation has been prepared under the direction and in the presence of Benjamin FowlerKayla Katelyne Galster, PA-C.  Electronically Signed: Gillis EndsJasmyn B. Lyn Bates, ED Scribe. 11/01/2015. 9:50 PM.   Chief Complaint  Patient presents with  . Hand Injury    The history is provided by the patient. No language interpreter was used.   HPI Comments: [name] is a 30 y.o. male who presents to the Emergency Department complaining of gradual onset, constant, right hand pain s/p hand injury that occurred 2 days PTA. Pt states he was cranking a slide, and slipped on some water when the crank spun and hit him on the top of his hand when he was trying to catch his fall. Pt has associated swelling of the hand. Pt has not taken any medications for pain. Denies any numbness or weakness of area.   Past Medical History  Diagnosis Date  . Seizures (HCC) as a teenager    x 1 - was never on anticonvulsant  . Cyst of skin 06/2012    left neck  . Cough 07/01/2012  . Runny nose 07/01/2012    clear drainage  . Teeth decayed   . Abrasions of multiple sites 07/01/2012    arms, face  . Appendicitis    Past Surgical History  Procedure Laterality Date  . Appendectomy    . Incision and drainage abscess  06/11/2012    Procedure: INCISION AND DRAINAGE ABSCESS;  Surgeon: Christia Readingwight Bates, MD;  [facility];  Service: ENT;  Laterality: N/A;  . Submaxillary cyst  excision Left 07/02/2012    Procedure: Excision of Left Neck Cyst;  Surgeon: Christia Readingwight Bates, MD;  Location:  [facility];  Service: ENT;  Laterality: Left;   No family history on file. Social History  Substance Use Topics  . Smoking status: Current Every Day Smoker -- 1.00 packs/day for 9 years    Types: Cigarettes  . Smokeless tobacco: Never Used  . Alcohol Use: No    Review of Systems  Musculoskeletal: Positive for myalgias, joint  swelling and arthralgias.  Neurological: Negative for numbness.  All other systems reviewed and are negative.  Allergies  Ultram  Home Medications   Prior to Admission medications   Medication Sig Start Date End Date Taking? Authorizing Provider  Aspirin-Acetaminophen (GOODYS BODY PAIN PO) Take 1 packet by mouth daily as needed (pain).    Historical Provider, MD  clindamycin (CLEOCIN) 150 MG capsule Take 3 tablets PO TID x 10 days 11/04/12   Marissa Sciacca, PA-C  HYDROcodone-acetaminophen (NORCO/VICODIN) 5-325 MG tablet Take 1 tablet by mouth every 6 (six) hours as needed. 11/01/15   Benjamin FowlerKayla Jaythan Hinely, PA-C  naproxen (NAPROSYN) 500 MG tablet Take 1 tablet (500 mg total) by mouth 2 (two) times daily. 11/04/12   Marissa Sciacca, PA-C   BP 112/79 mmHg  Pulse 84  Temp(Src) 97.9 F (36.6 C) (Oral)  Resp 20  Ht 5\' 9"  (1.753 m)  Wt 58.571 kg  BMI 19.06 kg/m2  SpO2 99% Physical Exam  Constitutional: He is oriented to person, place, and time. He appears well-developed and well-nourished.  Non-toxic appearance. He does not have a sickly appearance. He does not appear ill.  HENT:  Head: Normocephalic and atraumatic.  Right Ear: External ear normal.  Left Ear: External ear normal.  Mouth/Throat: Oropharynx is clear and moist.  Eyes: Conjunctivae are  normal. Pupils are equal, round, and reactive to light. No scleral icterus.  Neck: Normal range of motion. Neck supple. No tracheal deviation present.  Cardiovascular: Normal rate and regular rhythm.   Pulses:      Radial pulses are 2+ on the right side, and 2+ on the left side.  Good capillary refill.   Pulmonary/Chest: Effort normal and breath sounds normal. No accessory muscle usage or stridor. No respiratory distress. He has no wheezes. He has no rhonchi. He has no rales.  Abdominal: Soft. Bowel sounds are normal. He exhibits no distension. There is no tenderness.  Musculoskeletal: He exhibits edema and tenderness.       Right hand: He exhibits  decreased range of motion (secondary to pain), tenderness, bony tenderness and swelling. He exhibits normal capillary refill, no deformity and no laceration. Normal sensation noted. Decreased strength (secondary to pain) noted.  Lymphadenopathy:    He has no cervical adenopathy.  Neurological: He is alert and oriented to person, place, and time.  Speech clear without dysarthria.  Skin: Skin is warm and dry.  Psychiatric: He has a normal mood and affect. His behavior is normal.    ED Course  Procedures (including critical care time) DIAGNOSTIC STUDIES: Oxygen Saturation is 99% on RA, normal by my interpretation.    COORDINATION OF CARE: 9:49 PM-Discussed treatment plan which includes follow-up with hand specialist with pt at bedside and pt agreed to plan.   Imaging Review Dg Hand Complete Right  11/01/2015  CLINICAL DATA:  Injury to right hand 2 days ago. A large engine crank flew back and hit pt.'s right hand. Swelling and healing abrasions noted over the 2nd and third metacarpals. Pain is concentrated over the 2nd metacarpal. EXAM: RIGHT HAND - COMPLETE 3+ VIEW COMPARISON:  None available FINDINGS: Oblique fracture across the distal shaft of the second metacarpal, displaced less than a mm. No angulation deformity or override. No extension to the joint surface. No other fracture seen. Otherwise normal mineralization and alignment. No significant osseous degenerative change. Regional soft tissues unremarkable. IMPRESSION: 1. Nondisplaced oblique fracture, second metacarpal distal shaft. Electronically Signed   By: Corlis Leak M.D.   On: 11/01/2015 20:48   I have personally reviewed and evaluated these images and lab results as part of my medical decision-making.  MDM   Final diagnoses:  Closed fracture of 2nd metacarpal, initial encounter   Patient presents with right hand.  Neurovascularly intact.  Good capillary refill.  Plain films remarkable for nondisplaced oblique fx, second  metacarpal distal shaft.  No angulation, no joint involvement.  Placed in radial gutter splint and sling.  Norco for pain.  Follow up hand in 3-5 days for re-evaluation.  Discussed return precautions.  Patient agrees and acknowledges the above plan for discharge.   I personally performed the services described in this documentation, which was scribed in my presence. The recorded information has been reviewed and is accurate.   Benjamin Fowler, PA-C 11/01/15 2256  Cathren Laine, MD 11/01/15 [phone]

## 2015-11-01 NOTE — Discharge Instructions (Signed)
Metacarpal Fracture °A metacarpal fracture is a break (fracture) of a bone in the hand. Metacarpals are the bones that extend from your knuckles to your wrist. In each hand, you have five metacarpal bones that connect your fingers and your thumb to your wrist. °Some hand fractures have bone pieces that are close together and stable (simple). These fractures may be treated with only a splint or cast. Hand fractures that have many pieces of broken bone (comminuted), unstable bone pieces (displaced), or a bone that breaks through the skin (compound) usually require surgery. °CAUSES °This injury may be caused by: °· A fall. °· A hard, direct hit to your hand. °· An injury that squeezes your knuckle, stretches your finger out of place, or crushes your hand. °RISK FACTORS °This injury is more likely to occur if: °· You play contact sports. °· You have certain bone diseases. °SYMPTOMS  °Symptoms of this type of fracture develop soon after the injury. Symptoms may include: °· Swelling. °· Pain. °· Stiffness. °· Increased pain with movement. °· Bruising. °· Inability to move a finger. °· A shortened finger. °· A finger knuckle that looks sunken in. °· Unusual appearance of the hand or finger (deformity). °DIAGNOSIS  °This injury may be diagnosed based on your signs and symptoms, especially if you had a recent hand injury. Your health care provider will perform a physical exam. He or she may also order X-rays to confirm the diagnosis.  °TREATMENT  °Treatment for this injury depends on the type of fracture you have and how severe it is. Possible treatments include: °· Non-reduction. This can be done if the bone does not need to be moved back into place. The fracture can be casted or splinted as it is.   °· Closed reduction. If your bone is stable and can be moved back into place, you may only need to wear a cast or splint or have buddy taping. °· Closed reduction with internal fixation (CRIF). This is the most common  treatment. You may have this procedure if your bone can be moved back into place but needs more support. Wires, pins, or screws may be inserted through your skin to stabilize the fracture. °· Open reduction with internal fixation (ORIF). This may be needed if your fracture is severe and unstable. It involves surgery to move your bone back into the right position. Screws, wires, or plates are used to stabilize the fracture. °After all procedures, you may need to wear a cast or a splint for several weeks. You will also need to have follow-up X-rays to make sure that the bone is healing well and staying in position. After you no longer need your cast or splint, you may need physical therapy. This will help you to regain full movement and strength in your hand.  °HOME CARE INSTRUCTIONS  °If You Have a Cast: °· Do not stick anything inside the cast to scratch your skin. Doing that increases your risk of infection. °· Check the skin around the cast every day. Report any concerns to your health care provider. You may put lotion on dry skin around the edges of the cast. Do not apply lotion to the skin underneath the cast. °If You Have a Splint: °· Wear it as directed by your health care provider. Remove it only as directed by your health care provider. °· Loosen the splint if your fingers become numb and tingle, or if they turn cold and blue. °Bathing °· Cover the cast or splint with a   watertight plastic bag to protect it from water while you take a bath or a shower. Do not let the cast or splint get wet. °Managing Pain, Stiffness, and Swelling °· If directed, apply ice to the injured area (if you have a splint, not a cast): °¨ Put ice in a plastic bag. °¨ Place a towel between your skin and the bag. °¨ Leave the ice on for 20 minutes, 2-3 times a day. °· Move your fingers often to avoid stiffness and to lessen swelling. °· Raise the injured area above the level of your heart while you are sitting or lying  down. °Driving °· Do not drive or operate heavy machinery while taking pain medicine. °· Do not drive while wearing a cast or splint on a hand that you use for driving. °Activity °· Return to your normal activities as directed by your health care provider. Ask your health care provider what activities are safe for you. °General Instructions °· Do not put pressure on any part of the cast or splint until it is fully hardened. This may take several hours. °· Keep the cast or splint clean and dry. °· Do not use any tobacco products, including cigarettes, chewing tobacco, or electronic cigarettes. Tobacco can delay bone healing. If you need help quitting, ask your health care provider. °· Take medicines only as directed by your health care provider. °· Keep all follow-up visits as directed by your health care provider. This is important. °SEEK MEDICAL CARE IF:  °· Your pain is getting worse. °· You have redness, swelling, or pain in the injured area.   °· You have fluid, blood, or pus coming from under your cast or splint.   °· You notice a bad smell coming from under your cast or splint.   °· You have a fever.   °SEEK IMMEDIATE MEDICAL CARE IF:  °· You develop a rash.   °· You have trouble breathing.   °· Your skin or nails on your injured hand turn blue or gray even after you loosen your splint. °· Your injured hand feels cold or becomes numb even after you loosen your splint.   °· You develop severe pain under the cast or in your hand. °  °This information is not intended to replace advice given to you by your health care provider. Make sure you discuss any questions you have with your health care provider. °  °Document Released: 05/01/2005 Document Revised: 01/20/2015 Document Reviewed: 02/18/2014 °Elsevier Interactive Patient Education ©2016 [employer] ° °

## 2015-11-01 NOTE — ED Notes (Addendum)
Pt states he works for a [employer] and was putting a slide up, states the crank came out of his hand and was circling and hit his right hand. Pt can move all fingers, normal sensation, good pulse to right wrist. Right hand does appear swollen. Pt states that injury happened Saturday.

## 2015-11-01 NOTE — ED Notes (Signed)
Ortho called 

## 2015-11-01 NOTE — Progress Notes (Signed)
Orthopedic Tech Progress Note Patient Details:  Benjamin BellowsJoshua Bates 06-18-1985 [id number]  Ortho Devices Type of Ortho Device: Ace wrap, Arm sling, Volar splint Ortho Device/Splint Location: RUE hand Ortho Device/Splint Interventions: Ordered, Application   Jennye MoccasinHughes, Chriss Mannan Craig 11/01/2015, 10:17 PM

## 2016-06-19 ENCOUNTER — Emergency Department (HOSPITAL_COMMUNITY)
Admission: EM | Admit: 2016-06-19 | Discharge: 2016-06-19 | Disposition: A | Discharge status: Home / Self Care | Payer: Medicaid - Out of State | Attending: Physician Assistant | Admitting: Physician Assistant

## 2016-06-19 ENCOUNTER — Emergency Department (HOSPITAL_COMMUNITY): Payer: Medicaid - Out of State

## 2016-06-19 ENCOUNTER — Encounter (HOSPITAL_COMMUNITY): Payer: Self-pay | Admitting: Emergency Medicine

## 2016-06-19 DIAGNOSIS — F17210 Nicotine dependence, cigarettes, uncomplicated: Secondary | ICD-10-CM | POA: Diagnosis not present

## 2016-06-19 DIAGNOSIS — R05 Cough: Secondary | ICD-10-CM | POA: Diagnosis present

## 2016-06-19 DIAGNOSIS — J40 Bronchitis, not specified as acute or chronic: Secondary | ICD-10-CM | POA: Insufficient documentation

## 2016-06-19 DIAGNOSIS — K047 Periapical abscess without sinus: Secondary | ICD-10-CM

## 2016-06-19 MED ORDER — ALBUTEROL SULFATE HFA 108 (90 BASE) MCG/ACT IN AERS
2.0000 | INHALATION_SPRAY | RESPIRATORY_TRACT | Status: DC | PRN
  Filled 2016-06-19 (×2): qty 6.7

## 2016-06-19 MED ORDER — NAPROXEN 500 MG PO TABS: 500.0000 mg | ORAL_TABLET | Freq: Two times a day (BID) | ORAL | 0 refills | Status: AC

## 2016-06-19 MED ORDER — METRONIDAZOLE 500 MG PO TABS: 500.0000 mg | ORAL_TABLET | Freq: Two times a day (BID) | ORAL | 0 refills | Status: AC

## 2016-06-19 NOTE — ED Provider Notes (Signed)
[facility]-EM[facility]Y DEPT Provider Note   CSN: [id number] Arrival date & time: 06/19/16  1434  By signing my name below, I, [name], attest that this documentation has been prepared under the direction and in the presence of Benjamin BuffaloHope Alliana Mcauliff, NP. Electronically Signed: Talbert NanPaul Bates, Scribe. 06/19/16. 5:12 PM.   History   Chief Complaint Chief Complaint  Patient presents with  . Cough  . Chest Pain  . Dental Pain    HPI 4:38 PM Went to patients room and patient was not there.   5:00 PM Checked on patient, but patient was not in room.  [name] is a 31 y.o. male who presents to the Emergency Department complaining of right sided chest pain and upper right tooth pain. Pt has associated chest tightness, cough which began 05/25/16, wheezing, fever of 102, diarrhea 1-2 times a day that is describes as loose and watery. Pt visited doctor in 11/18 and was told that he had bronchitis. Pt was given antibiotics and used them with limited relief. Pt smokes 2 ppd for the past 15 years. Pt has been taking cold max for cough with limited relief. Pt denies nausea and vomiting.       The history is provided by the patient. No language interpreter was used.  Cough  The current episode started more than 1 week ago. The problem has not changed since onset.The cough is productive of sputum. Associated symptoms include chest pain (with cough) and sore throat. Pertinent negatives include no headaches. He has tried cough syrup for the symptoms. The treatment provided mild relief. He is a smoker.  Chest Pain   Associated symptoms include cough and a fever. Pertinent negatives include no abdominal pain, no headaches, no nausea and no vomiting.  Dental Pain   This is a new problem. The current episode started more than 2 days ago. The problem occurs constantly. The problem has been gradually worsening. The pain is at a severity of 6/10. He has tried nothing for the symptoms.    Past Medical History:  Diagnosis  Date  . Abrasions of multiple sites 07/01/2012   arms, face  . Appendicitis   . Cough 07/01/2012  . Cyst of skin 06/2012   left neck  . Runny nose 07/01/2012   clear drainage  . Seizures (HCC) as a teenager   x 1 - was never on anticonvulsant  . Teeth decayed     Patient Active Problem List   Diagnosis Date Noted  . Infected sebaceous cyst of skin 06/12/2012  . Leukocytosis 06/11/2012  . Mass of neck 06/10/2012    Past Surgical History:  Procedure Laterality Date  . APPENDECTOMY    . INCISION AND DRAINAGE ABSCESS  06/11/2012   Procedure: INCISION AND DRAINAGE ABSCESS;  Surgeon: Benjamin Readingwight Bates, MD;  [facility];  Service: ENT;  Laterality: N/A;  . SUBMAXILLARY CYST  EXCISION Left 07/02/2012   Procedure: Excision of Left Neck Cyst;  Surgeon: Benjamin Readingwight Bates, MD;  Location: [facility];  Service: ENT;  Laterality: Left;       Home Medications    Prior to Admission medications   Medication Sig Start Date End Date Taking? Authorizing Provider  Aspirin-Acetaminophen (GOODYS BODY PAIN PO) Take 1 packet by mouth daily as needed (pain).   Yes Historical Provider, MD  Pseudoeph-Doxylamine-DM-APAP (DAYQUIL/NYQUIL COLD/FLU RELIEF PO) Take 30 mLs by mouth daily as needed (cold).   Yes Historical Provider, MD    Family History No family history on file.  Social History  Social History  Substance Use Topics  . Smoking status: Current Every Day Smoker    Packs/day: 1.00    Years: 9.00    Types: Cigarettes  . Smokeless tobacco: Never Used  . Alcohol use No     Allergies   Ultram [tramadol hcl]   Review of Systems Review of Systems  Constitutional: Positive for fever.  HENT: Positive for dental problem and sore throat. Negative for trouble swallowing.   Respiratory: Positive for cough and chest tightness.   Cardiovascular: Positive for chest pain (with cough).  Gastrointestinal: Positive for diarrhea. Negative for abdominal pain, nausea and vomiting.    Genitourinary: Negative for dysuria.  Skin: Negative for rash.  Neurological: Negative for headaches.     Physical Exam Updated Vital Signs BP 139/82 (BP Location: Left Arm)   Pulse 76   Temp 98.7 F (37.1 C) (Oral)   Resp 16   SpO2 100%   Physical Exam  Constitutional: He is oriented to person, place, and time. He appears well-developed and well-nourished.  HENT:  Head: Normocephalic and atraumatic.  Right Ear: Tympanic membrane normal.  Left Ear: Tympanic membrane normal.  Nose: Nose normal.  Mouth/Throat: Uvula is midline and mucous membranes are normal. Posterior oropharyngeal erythema (mild) present. No oropharyngeal exudate or posterior oropharyngeal edema.   Tms normal. Right upper 1st molar is decayed. Area around tooth erethyamtous.  Multiple dental caries.  Eyes: EOM are normal. Pupils are equal, round, and reactive to light. No scleral icterus.  Neck: Normal range of motion. Neck supple.  Small anterior nodes are palpable.  Cardiovascular: Normal rate, regular rhythm and normal heart sounds.   Pulmonary/Chest: Effort normal. No respiratory distress. He has no wheezes. He has no rales.  Musculoskeletal: Normal range of motion.  Lymphadenopathy:    He has no cervical adenopathy.  Neurological: He is alert and oriented to person, place, and time.  Skin: Skin is warm and dry.  Psychiatric: He has a normal mood and affect. His behavior is normal.  Nursing note and vitals reviewed.    ED Treatments / Results   DIAGNOSTIC STUDIES: Oxygen Saturation is 100% on room air, normal by my interpretation.    COORDINATION OF CARE: 5:12 PM Discussed treatment plan with pt at bedside and pt agreed to plan, which include antibiotics for the tooth.   Labs (all labs ordered are listed, but only abnormal results are displayed) Labs Reviewed - No data to display  EKG  EKG Interpretation  Date/Time:  Monday June 19 2016 15:07:53 EST Ventricular Rate:  83 PR  Interval:  126 QRS Duration: 96 QT Interval:  344 QTC Calculation: 404 R Axis:   90 Text Interpretation:  Normal sinus rhythm Rightward axis Borderline ECG Confirmed by Fayrene Fearing  MD, MARK ([id number]) on 06/19/2016 3:14:09 PM       Radiology Dg Chest 2 View  Result Date: 06/19/2016 CLINICAL DATA:  Chronic chest pain. Productive cough and shortness of breath. EXAM: CHEST  2 VIEW COMPARISON:  None. FINDINGS: The heart size and mediastinal contours are within normal limits. Both lungs are clear except for minimal peribronchial thickening. The visualized skeletal structures are unremarkable. Slight thoracolumbar scoliosis. IMPRESSION: Slight bronchitic changes. Electronically Signed   By: Francene Boyers M.D.   On: 06/19/2016 15:48    Procedures Procedures (including critical care time)  Medications Ordered in ED Medications - No data to display   Initial Impression / Assessment and Plan / ED Course  I have reviewed the triage vital signs and  the nursing notes.  Pertinent imaging results that were available during my care of the patient were reviewed by me and considered in my medical decision making (see chart for details).   Final Clinical Impressions(s) / ED Diagnoses  31 y.o. male with cough and congestion and right side chest wall tenderness with cough and movement, diarrhea and dental infection stable for d/c without fever and has no pneumonia or mass on x-ray. Will treat for bronchitis and will treat the infected tooth with antibiotics. Discussed with the patient need for f/u with dentist and referral given. Patient agrees with plan.   Final diagnoses:  Bronchitis  Dental infection    New Prescriptions New Prescriptions   No medications on file   I personally performed the services described in this documentation, which was scribed in my presence. The recorded information has been reviewed and is accurate.     [place], NP 06/19/16 1732    Courteney Randall An,  MD 06/22/16 Moses Manners

## 2016-06-19 NOTE — ED Triage Notes (Signed)
Chest congestion cough and tooth pain x several days states smokes 2 packs of ciggs a day

## 2016-06-19 NOTE — ED Notes (Signed)
Pt. Demonstrated proper use of albuterol inhaler with aerochamber.

## 2016-06-19 NOTE — Discharge Instructions (Signed)
Your may continue to use the DayQuil/Nyquil in addition to the medications we give you. Call the dentis that we give you the number for and schedule an appointment.

## 2017-03-26 ENCOUNTER — Encounter (HOSPITAL_COMMUNITY): Payer: Self-pay | Admitting: Emergency Medicine

## 2017-03-26 ENCOUNTER — Emergency Department (HOSPITAL_COMMUNITY)
Admission: EM | Admit: 2017-03-26 | Discharge: 2017-03-26 | Disposition: A | Discharge status: Home / Self Care | Payer: Medicaid - Out of State | Attending: Emergency Medicine | Admitting: Emergency Medicine

## 2017-03-26 ENCOUNTER — Encounter (HOSPITAL_COMMUNITY): Payer: Self-pay

## 2017-03-26 DIAGNOSIS — K029 Dental caries, unspecified: Secondary | ICD-10-CM

## 2017-03-26 DIAGNOSIS — F17210 Nicotine dependence, cigarettes, uncomplicated: Secondary | ICD-10-CM | POA: Insufficient documentation

## 2017-03-26 DIAGNOSIS — Z79899 Other long term (current) drug therapy: Secondary | ICD-10-CM | POA: Insufficient documentation

## 2017-03-26 DIAGNOSIS — Z5321 Procedure and treatment not carried out due to patient leaving prior to being seen by health care provider: Secondary | ICD-10-CM | POA: Insufficient documentation

## 2017-03-26 DIAGNOSIS — K0889 Other specified disorders of teeth and supporting structures: Secondary | ICD-10-CM | POA: Insufficient documentation

## 2017-03-26 DIAGNOSIS — Z720 Tobacco use: Secondary | ICD-10-CM

## 2017-03-26 MED ORDER — DOXYCYCLINE HYCLATE 100 MG PO CAPS: 100.0000 mg | ORAL_CAPSULE | Freq: Two times a day (BID) | ORAL | 0 refills | Status: AC

## 2017-03-26 NOTE — ED Notes (Signed)
Pt called for D32 no answer

## 2017-03-26 NOTE — Discharge Instructions (Signed)
Apply warm compresses to jaw throughout the day. Take antibiotic until finished. Alternate between tylenol and motrin as needed for pain. Perform salt water swishes to help with pain/swelling. Use over the counter oragel as needed for additional relief. STOP SMOKING! Followup with a dentist is very important for ongoing evaluation and management of recurrent dental pain, call your oral surgeon today to set up an appointment for ongoing management of your dental pain.  Return to emergency department for emergent changing or worsening symptoms.

## 2017-03-26 NOTE — ED Notes (Signed)
Called pt twice, no answer.

## 2017-03-26 NOTE — ED Triage Notes (Signed)
Pt report that he has dental pain to the rt side upper and lower teeth that has been ongoing x 1 month and has worsened the patient states that he does not have health insurance 10/10 pain throbbing radiating to head and  ear on rt side. Pt has some swelling noted right  Side of face. Pt has multiple decayed teeth . Pt escorted with spouse. Pt reports using goody powders for pain and is no longer effective.

## 2017-03-26 NOTE — ED Provider Notes (Signed)
[facility]-EMERGENCY DEPT Provider Note   CSN: [id number] Arrival date & time: 03/26/17  1349     History   Chief Complaint Chief Complaint  Patient presents with  . Dental Pain    HPI [name] is a 31 y.o. male with a PMHx of decayed teeth, who presents to the ED with complaints of right upper and lower dental pain x1 month.  Patient states that he has been seen by a dentist who referred him to an oral surgeon, however he has not been able to afford it so he has not followed up yet.  He describes the pain as 10/10 intermittent sharp nonradiating right upper and lower dental pain, worse with exposure to cold or hot things as well as chewing, and minimally improved with Goody's powders, Tylenol, and Red Cross gel.  He reports associated gum swelling.  He denies any gum drainage, drooling, trismus, fevers, chills, CP, SOB, abd pain, N/V/D/C, hematuria, dysuria, myalgias, arthralgias, numbness, tingling, focal weakness, or any other complaints at this time. He admits to being a cigarette smoker.    The history is provided by the patient and medical records. No language interpreter was used.  Dental Pain   This is a chronic problem. The current episode started more than 1 week ago. The problem occurs daily. The problem has not changed since onset.The pain is at a severity of 10/10. The pain is moderate. He has tried acetaminophen (and goody's powders and Red Cross gel) for the symptoms. The treatment provided mild relief.    Past Medical History:  Diagnosis Date  . Abrasions of multiple sites 07/01/2012   arms, face  . Appendicitis   . Cough 07/01/2012  . Cyst of skin 06/2012   left neck  . Runny nose 07/01/2012   clear drainage  . Seizures (HCC) as a teenager   x 1 - was never on anticonvulsant  . Teeth decayed     Patient Active Problem List   Diagnosis Date Noted  . Infected sebaceous cyst of skin 06/12/2012  . Leukocytosis 06/11/2012  . Mass of  neck 06/10/2012    Past Surgical History:  Procedure Laterality Date  . APPENDECTOMY         Home Medications    Prior to Admission medications   Medication Sig Start Date End Date Taking? Authorizing Provider  metroNIDAZOLE (FLAGYL) 500 MG tablet Take 1 tablet (500 mg total) by mouth 2 (two) times daily. 06/19/16   Janne NapoleonNeese, Hope M, NP  naproxen (NAPROSYN) 500 MG tablet Take 1 tablet (500 mg total) by mouth 2 (two) times daily. 06/19/16   Janne NapoleonNeese, Hope M, NP  Pseudoeph-Doxylamine-DM-APAP (DAYQUIL/NYQUIL COLD/FLU RELIEF PO) Take 30 mLs by mouth daily as needed (cold).    [provider]    Family History History reviewed. No pertinent family history.  Social History Social History   Tobacco Use  . Smoking status: Current Every Day Smoker    Packs/day: 1.00    Years: 9.00    Pack years: 9.00    Types: Cigarettes  . Smokeless tobacco: Never Used  Substance Use Topics  . Alcohol use: No  . Drug use: No     Allergies   Ultram [tramadol hcl]   Review of Systems Review of Systems  Constitutional: Negative for chills and fever.  HENT: Positive for dental problem. Negative for drooling and trouble swallowing.   Respiratory: Negative for shortness of breath.   Cardiovascular: Negative for chest pain.  Gastrointestinal: Negative for  abdominal pain, constipation, diarrhea, nausea and vomiting.  Genitourinary: Negative for dysuria and hematuria.  Musculoskeletal: Negative for arthralgias and myalgias.  Skin: Negative for color change.  Allergic/Immunologic: Negative for immunocompromised state.  Neurological: Negative for weakness and numbness.  Psychiatric/Behavioral: Negative for confusion.   All other systems reviewed and are negative for acute change except as noted in the HPI.    Physical Exam Updated Vital Signs BP 139/87   Pulse 92   Temp 98.5 F (36.9 C) (Oral)   Resp 18   Ht 5\' 9"  (1.753 m)   Wt 59 kg (130 lb)   SpO2 98%   BMI 19.20 kg/m   Physical  Exam  Constitutional: He is oriented to person, place, and time. Vital signs are normal. He appears well-developed and well-nourished.  Non-toxic appearance. No distress.  Afebrile, nontoxic, NAD  HENT:  Head: Normocephalic and atraumatic.  Nose: Nose normal.  Mouth/Throat: Uvula is midline, oropharynx is clear and moist and mucous membranes are normal. No trismus in the jaw. Dental caries present. No dental abscesses or uvula swelling. Tonsils are 0 on the right. Tonsils are 0 on the left. No tonsillar exudate.  Nose clear.  Diffuse dental decay, multiple missing teeth. Significantly decayed teeth on R upper and lower teeth, with slight surrounding erythema and swelling but no definite dental abscess. No evidence of ludwig's. Oropharynx clear and moist, without uvular swelling or deviation, no trismus or drooling, no tonsillar swelling or erythema, no exudates.    Eyes: Conjunctivae and EOM are normal. Right eye exhibits no discharge. Left eye exhibits no discharge.  Neck: Normal range of motion. Neck supple.  Cardiovascular: Normal rate and intact distal pulses.  Pulmonary/Chest: Effort normal. No respiratory distress.  Abdominal: Normal appearance. He exhibits no distension.  Musculoskeletal: Normal range of motion.  Neurological: He is alert and oriented to person, place, and time. He has normal strength. No sensory deficit.  Skin: Skin is warm, dry and intact. No rash noted.  Psychiatric: He has a normal mood and affect.  Nursing note and vitals reviewed.    ED Treatments / Results  Labs (all labs ordered are listed, but only abnormal results are displayed) Labs Reviewed - No data to display  EKG  EKG Interpretation None       Radiology No results found.  Procedures Procedures (including critical care time)  Medications Ordered in ED Medications - No data to display   Initial Impression / Assessment and Plan / ED Course  I have reviewed the triage vital signs and the  nursing notes.  Pertinent labs & imaging results that were available during my care of the patient were reviewed by me and considered in my medical decision making (see chart for details).     31 y.o. male here with Dental pain associated with diffuse dental decay and possible dental infection but no definite abscess, with patient afebrile, non toxic appearing and swallowing secretions well, no evidence of ludwig's. Pt already saw a dentist and was referred to oral surgeon but can't afford it. I stressed the importance of oral surgery follow up for ultimate management of dental pain.  I have also discussed reasons to return immediately to the ER.  Patient expresses understanding and agrees with plan.  I will also give doxycycline and discussed OTC remedies for pain control. Smoking cessation advised.    Final Clinical Impressions(s) / ED Diagnoses   Final diagnoses:  Pain due to dental caries  Dental decay  Tobacco user  ED Discharge Orders        Ordered    doxycycline (VIBRAMYCIN) 100 MG capsule  2 times daily     03/26/17 [address]1431       Demoni Parmar, TempletonMercedes, [place] JerseyPA-C 03/26/17 1443    Rolland PorterJames, Mark, MD 04/01/17 2304

## 2017-03-26 NOTE — ED Triage Notes (Signed)
Patient reports right upper and lower molars pain for several weeks unrelieved by OTC Goody powder.

## 2017-06-01 ENCOUNTER — Emergency Department (HOSPITAL_COMMUNITY)
Admission: EM | Admit: 2017-06-01 | Discharge: 2017-06-01 | Discharge status: Against Medical Advice | Payer: Medicaid - Out of State

## 2017-06-01 NOTE — ED Notes (Signed)
Pt called for triage x3. No answer.  

## 2017-06-01 NOTE — ED Triage Notes (Signed)
Called x 1. No answer in lobby.  

## 2017-06-01 NOTE — ED Triage Notes (Signed)
Called x 2. No answer 

## 2017-06-04 ENCOUNTER — Encounter (HOSPITAL_COMMUNITY): Payer: Self-pay | Admitting: Emergency Medicine

## 2017-06-04 DIAGNOSIS — L0231 Cutaneous abscess of buttock: Secondary | ICD-10-CM | POA: Diagnosis not present

## 2017-06-04 DIAGNOSIS — Z5321 Procedure and treatment not carried out due to patient leaving prior to being seen by health care provider: Secondary | ICD-10-CM | POA: Insufficient documentation

## 2017-06-04 NOTE — ED Triage Notes (Signed)
Patient reports skin abscess at left upper buttocks form a "spider bite" 4 days ago . Denies fever or chills .

## 2017-06-05 ENCOUNTER — Emergency Department (HOSPITAL_COMMUNITY)
Admission: EM | Admit: 2017-06-05 | Discharge: 2017-06-05 | Discharge status: Against Medical Advice | Payer: Medicaid - Out of State | Attending: Emergency Medicine | Admitting: Emergency Medicine

## 2017-06-05 NOTE — ED Notes (Signed)
Pt called for treatment room no answer 

## 2017-06-08 NOTE — ED Notes (Signed)
Called pt. For a follow-up call, message left for a return call.  06/08/2017, 10:55

## 2017-06-09 ENCOUNTER — Emergency Department (HOSPITAL_COMMUNITY)
Admission: EM | Admit: 2017-06-09 | Discharge: 2017-06-09 | Disposition: A | Discharge status: Home / Self Care | Payer: Medicaid - Out of State | Attending: Emergency Medicine | Admitting: Emergency Medicine

## 2017-06-09 ENCOUNTER — Encounter (HOSPITAL_COMMUNITY): Payer: Self-pay | Admitting: Emergency Medicine

## 2017-06-09 DIAGNOSIS — L089 Local infection of the skin and subcutaneous tissue, unspecified: Secondary | ICD-10-CM | POA: Insufficient documentation

## 2017-06-09 DIAGNOSIS — M791 Myalgia, unspecified site: Secondary | ICD-10-CM | POA: Insufficient documentation

## 2017-06-09 DIAGNOSIS — L03317 Cellulitis of buttock: Secondary | ICD-10-CM

## 2017-06-09 DIAGNOSIS — F17210 Nicotine dependence, cigarettes, uncomplicated: Secondary | ICD-10-CM | POA: Insufficient documentation

## 2017-06-09 DIAGNOSIS — Z79899 Other long term (current) drug therapy: Secondary | ICD-10-CM | POA: Insufficient documentation

## 2017-06-09 MED ORDER — CEPHALEXIN 500 MG PO CAPS: 500.0000 mg | ORAL_CAPSULE | Freq: Four times a day (QID) | ORAL | 0 refills | Status: AC

## 2017-06-09 MED ORDER — SULFAMETHOXAZOLE-TRIMETHOPRIM 800-160 MG PO TABS: 1.0000 | ORAL_TABLET | Freq: Two times a day (BID) | ORAL | 0 refills | Status: AC

## 2017-06-09 NOTE — Discharge Instructions (Signed)
It was my pleasure taking care of you today!   Please take all of your antibiotics until finished!  Continue to keep wounds clean, dry and covered with topical antibiotic ointment.   Return to ER for wound check in 2 days. Return sooner for fevers, new or worsening symptoms, any additional concerns.

## 2017-06-09 NOTE — ED Provider Notes (Signed)
[facility]-EMERGENCY DEPT Provider Note   CSN: [id number] Arrival date & time: 06/09/17  6578     History   Chief Complaint Chief Complaint  Patient presents with  . leg infection  . butt abscess    HPI [name] is a 32 y.o. male.  The history is provided by the patient and medical records. No language interpreter was used.    [name] is a 32 y.o. male who presents to the Emergency Department complaining of two wounds to the left gluteal region x 1 week. One is actively draining yellow pus, the other is not draining. Endorses swelling and redness around the site. Pain worse with any pressure. He has been using ibuprofen for pain and cleaning with peroxide and triple ABX ointment. No objective fever and afebrile in ED.  Past Medical History:  Diagnosis Date  . Abrasions of multiple sites 07/01/2012   arms, face  . Appendicitis   . Cough 07/01/2012  . Cyst of skin 06/2012   left neck  . Runny nose 07/01/2012   clear drainage  . Seizures (HCC) as a teenager   x 1 - was never on anticonvulsant  . Teeth decayed     Patient Active Problem List   Diagnosis Date Noted  . Infected sebaceous cyst of skin 06/12/2012  . Leukocytosis 06/11/2012  . Mass of neck 06/10/2012    Past Surgical History:  Procedure Laterality Date  . APPENDECTOMY    . INCISION AND DRAINAGE ABSCESS  06/11/2012   Procedure: INCISION AND DRAINAGE ABSCESS;  Surgeon: Christia Reading, MD;  Location: [facility] OR;  Service: ENT;  Laterality: N/A;  . SUBMAXILLARY CYST  EXCISION Left 07/02/2012   Procedure: Excision of Left Neck Cyst;  Surgeon: Christia Reading, MD;  Location: [facility];  Service: ENT;  Laterality: Left;       Home Medications    Prior to Admission medications   Medication Sig Start Date End Date Taking? Authorizing Provider  ibuprofen (ADVIL,MOTRIN) 600 MG tablet Take 600 mg by mouth every 6 (six) hours as needed for mild pain.   Yes [provider]  cephALEXin (KEFLEX) 500 MG capsule Take 1 capsule (500 mg total) by mouth 4 (four) times daily. 06/09/17   Rayan Dyal, Chase Picket, PA-C  doxycycline (VIBRAMYCIN) 100 MG capsule Take 1 capsule (100 mg total) 2 (two) times daily by mouth. One po bid x 7 days 03/26/17   Street, [place], PA-C  metroNIDAZOLE (FLAGYL) 500 MG tablet Take 1 tablet (500 mg total) by mouth 2 (two) times daily. 06/19/16   Janne Napoleon, NP  naproxen (NAPROSYN) 500 MG tablet Take 1 tablet (500 mg total) by mouth 2 (two) times daily. 06/19/16   Janne Napoleon, NP  sulfamethoxazole-trimethoprim (BACTRIM DS,SEPTRA DS) 800-160 MG tablet Take 1 tablet by mouth 2 (two) times daily for 7 days. 06/09/17 06/16/17  Tashea Othman, Chase Picket, PA-C    Family History No family history on file.  Social History Social History   Tobacco Use  . Smoking status: Current Every Day Smoker    Packs/day: 1.00    Years: 9.00    Pack years: 9.00    Types: Cigarettes  . Smokeless tobacco: Never Used  Substance Use Topics  . Alcohol use: No  . Drug use: No     Allergies   Ultram [tramadol hcl]   Review of Systems Review of Systems  Musculoskeletal: Positive for myalgias.  Skin: Positive for color change and wound.  All other systems reviewed and are negative.    Physical Exam Updated Vital Signs BP 138/90   Pulse 88   Temp 97.8 F (36.6 C) (Oral)   Resp 19   Ht 5\' 9"  (1.753 m)   Wt 59 kg (130 lb)   SpO2 99%   BMI 19.20 kg/m   Physical Exam  Constitutional: He is oriented to person, place, and time. He appears well-developed and well-nourished. No distress.  HENT:  Head: Normocephalic and atraumatic.  Cardiovascular: Normal rate, regular rhythm and normal heart sounds.  No murmur heard. Pulmonary/Chest: Effort normal and breath sounds normal. No respiratory distress.  Abdominal: Soft. He exhibits no distension. There is no tenderness.  Musculoskeletal: Normal range of motion.  Neurological: He is alert and  oriented to person, place, and time.  Skin: Skin is warm and dry.  See images below: Top wound with mild surround erythema. Lower image with no focal area of fluctuance. This is tender with moderate amount of surrounding erythema. No active drainage.  Nursing note and vitals reviewed.           ED Treatments / Results  Labs (all labs ordered are listed, but only abnormal results are displayed) Labs Reviewed - No data to display  EKG  EKG Interpretation None       Radiology No results found.  Procedures Procedures (including critical care time)  EMERGENCY DEPARTMENT US SOFT TISSUE INTERPRETATION "Study: Limited Soft Tissue Ultrasound"  INDICATIONS: Soft tissue infection Multiple views of the body part were obtained in real-time with a multi-frequency linear probe  PERFORMED BY: Myself IMAGES ARCHIVED?: Yes SIDE:Left BODY PART:Buttock INTERPRETATION:  Cellulitis present    Medications Ordered in ED Medications - No data to display   Initial Impression / Assessment and Plan / ED Course  I have reviewed the triage vital signs and the nursing notes.  Pertinent labs & imaging results that were available during my care of the patient were reviewed by me and considered in my medical decision making (see chart for details).    [name] is a 32 y.o. male who presents to ED for wounds to left buttock. Upper wound with no signs of abscess. Ultrasound performed to lower wound with cellulitis but no convincing evidence of focal area to drain. Despite this, did recommend that we open the area for better healing. Patient would prefer to try oral ABX and defer I&D at this time. I spoke with patient that if we do not perform I&D, he may have to come back in a few days and ultimately have procedure done. He understands. Will treat with keflex / bactrim and patient will return for wound check in 2 days. He understands to return sooner for fevers or worsening symptoms. All  questions answered.   Patient seen by and discussed with Dr. Estell HarpinZammit who agrees with treatment plan.   Final Clinical Impressions(s) / ED Diagnoses   Final diagnoses:  Cellulitis of buttock    ED Discharge Orders        Ordered    sulfamethoxazole-trimethoprim (BACTRIM DS,SEPTRA DS) 800-160 MG tablet  2 times daily     06/09/17 1041    cephALEXin (KEFLEX) 500 MG capsule  4 times daily     06/09/17 1041       Dawan Farney, Chase PicketJaime Pilcher, PA-C 06/09/17 1108    Bethann BerkshireZammit, Joseph, MD 06/09/17 1314

## 2017-06-09 NOTE — ED Triage Notes (Signed)
Reports that thought had a spider bite on left buttock 2 weeks ago but gotten worse and now redness if spreading down his left leg. Has had drainage out of area.

## 2017-12-05 ENCOUNTER — Encounter (HOSPITAL_COMMUNITY): Payer: Self-pay

## 2017-12-05 ENCOUNTER — Other Ambulatory Visit: Payer: Self-pay

## 2017-12-05 ENCOUNTER — Emergency Department (HOSPITAL_COMMUNITY)
Admission: EM | Admit: 2017-12-05 | Discharge: 2017-12-05 | Disposition: A | Discharge status: Home / Self Care | Payer: Medicaid - Out of State | Attending: Emergency Medicine | Admitting: Emergency Medicine

## 2017-12-05 DIAGNOSIS — L720 Epidermal cyst: Secondary | ICD-10-CM | POA: Insufficient documentation

## 2017-12-05 DIAGNOSIS — F17210 Nicotine dependence, cigarettes, uncomplicated: Secondary | ICD-10-CM | POA: Insufficient documentation

## 2017-12-05 MED ORDER — CLINDAMYCIN HCL 150 MG PO CAPS: 150.0000 mg | ORAL_CAPSULE | Freq: Four times a day (QID) | ORAL | 0 refills | Status: DC

## 2017-12-05 MED ORDER — CLINDAMYCIN HCL 150 MG PO CAPS: 150.0000 mg | ORAL_CAPSULE | Freq: Four times a day (QID) | ORAL | 0 refills | Status: AC

## 2017-12-05 NOTE — ED Triage Notes (Signed)
Pt has abscess to R side of neck, hx of the same, no drainage noted

## 2017-12-05 NOTE — ED Provider Notes (Signed)
[facility] EMERGENCY DEPARTMENT Provider Note   CSN: [id number] Arrival date & time: 12/05/17  2109     History   Chief Complaint Chief Complaint  Patient presents with  . Abscess    HPI [name] is a 32 y.o. male with a history of prior submaxillary cyst excision on the left by Dr. Jenne Pane who presents emergency department today for possible abscess to the right neck.  Patient reports that he has had a small area that has been increasing in size for the last 1 month.  As of the last 2 days it is increased in size and now become a little more swollen.  He reports the area is mobile and tender to the touch.  No documented fevers at home.  He reports some nausea without any emesis.  He denies any other symptoms.  He has have poor dentition but denies any dental pain or swelling at this time.  The area has not drained.  He denies any interventions prior to arrival.  No other complaints at this time.  HPI  Past Medical History:  Diagnosis Date  . Abrasions of multiple sites 07/01/2012   arms, face  . Appendicitis   . Cough 07/01/2012  . Cyst of skin 06/2012   left neck  . Runny nose 07/01/2012   clear drainage  . Seizures (HCC) as a teenager   x 1 - was never on anticonvulsant  . Teeth decayed     Patient Active Problem List   Diagnosis Date Noted  . Infected sebaceous cyst of skin 06/12/2012  . Leukocytosis 06/11/2012  . Mass of neck 06/10/2012    Past Surgical History:  Procedure Laterality Date  . APPENDECTOMY    . INCISION AND DRAINAGE ABSCESS  06/11/2012   Procedure: INCISION AND DRAINAGE ABSCESS;  Surgeon: Christia Reading, MD;  Location: [facility] OR;  Service: ENT;  Laterality: N/A;  . SUBMAXILLARY CYST  EXCISION Left 07/02/2012   Procedure: Excision of Left Neck Cyst;  Surgeon: Christia Reading, MD;  Location: [facility];  Service: ENT;  Laterality: Left;        Home Medications    Prior to Admission medications   Medication Sig Start  Date End Date Taking? Authorizing Provider  cephALEXin (KEFLEX) 500 MG capsule Take 1 capsule (500 mg total) by mouth 4 (four) times daily. 06/09/17   Ward, Chase Picket, PA-C  doxycycline (VIBRAMYCIN) 100 MG capsule Take 1 capsule (100 mg total) 2 (two) times daily by mouth. One po bid x 7 days 03/26/17   Street, [place], PA-C  ibuprofen (ADVIL,MOTRIN) 600 MG tablet Take 600 mg by mouth every 6 (six) hours as needed for mild pain.    [provider]  metroNIDAZOLE (FLAGYL) 500 MG tablet Take 1 tablet (500 mg total) by mouth 2 (two) times daily. 06/19/16   Janne Napoleon, NP  naproxen (NAPROSYN) 500 MG tablet Take 1 tablet (500 mg total) by mouth 2 (two) times daily. 06/19/16   Janne Napoleon, NP    Family History No family history on file.  Social History Social History   Tobacco Use  . Smoking status: Current Every Day Smoker    Packs/day: 1.00    Years: 9.00    Pack years: 9.00    Types: Cigarettes  . Smokeless tobacco: Never Used  Substance Use Topics  . Alcohol use: No  . Drug use: No     Allergies   Ultram [tramadol hcl]   Review of  Systems Review of Systems  All other systems reviewed and are negative.    Physical Exam Updated Vital Signs BP 114/75 (BP Location: Right Arm)   Pulse 79   Temp 98.1 F (36.7 C) (Oral)   Resp 18   SpO2 100%   Physical Exam  Constitutional: He appears well-developed and well-nourished.  HENT:  Head: Normocephalic and atraumatic.  Right Ear: External ear normal.  Left Ear: External ear normal.  The patient has normal phonation and is in control of secretions. No stridor.  Midline uvula without edema. Soft palate rises symmetrically.  No tonsillar erythema or exudates. No PTA. Tongue protrusion is normal. No trismus. Patient has good dentition however there is no gingival erythema or fluctuance noted. No extension from oral cavity to swollen area on neck. No floor edema. Mucus membranes moist.   Eyes: Conjunctivae are normal.  Right eye exhibits no discharge. Left eye exhibits no discharge. No scleral icterus.  Neck: Full passive range of motion without pain. No spinous process tenderness present. No neck rigidity. Normal range of motion present.  Patient with a 2 cm x 1 cm mobile firm cyst versus lymph node on the submandibular region.  There is a small amount of overlying erythema and heat.  No crepitus on neck palpation. No nuchal rigidity or meningismus. No stridor.   Pulmonary/Chest: Effort normal. No respiratory distress.  Neurological: He is alert.  Skin: Skin is warm and dry. There is erythema. No pallor.  Psychiatric: He has a normal mood and affect.  Nursing note and vitals reviewed.    ED Treatments / Results  Labs (all labs ordered are listed, but only abnormal results are displayed) Labs Reviewed - No data to display  EKG None  Radiology No results found.  Procedures Procedures (including critical care time)  Medications Ordered in ED Medications - No data to display   Initial Impression / Assessment and Plan / ED Course  I have reviewed the triage vital signs and the nursing notes.  Pertinent labs & imaging results that were available during my care of the patient were reviewed by me and considered in my medical decision making (see chart for details).     32 y.o. with a history of prior submaxillary cyst excision on the left by Dr. Jenne PaneBates who presents emergency department today for possible abscess to the right neck.  Patient denies document of fevers at home.  He reports some nausea without any vomiting.  His vital signs are reassuring on presentation.  He is without fever, tachycardia, tachypnea, hypoxia or hypotension.  Patient with a 2 cm x 1 cm area under the right sub mandible that is mobile and tender to touch.  There appears to be some overlying erythema and heat.  There is no focal fluctuance or surrounding induration.  Bedside ultrasound without focal fluid collection.  Patient  without extension from oral mucosa on exam.  There is no floor edema.  After discussion and evaluation with my attending plan to treat with warm compresses 4 times daily and clindamycin and referred to Dr. Jenne PaneBates of ENT.  Patient is agreement with plan. I advised the patient to follow-up with Dr. Jenne PaneBates of ENT this week. Specific return precautions discussed. Time was given for all questions to be answered. The patient verbalized understanding and agreement with plan. The patient appears safe for discharge home.  Patient case seen and discussed with Dr. Adela LankFloyd who is in agreement with plan.   Final Clinical Impressions(s) / ED Diagnoses  Final diagnoses:  Epidermal cyst of neck    ED Discharge Orders        Ordered    clindamycin (CLEOCIN) 150 MG capsule  Every 6 hours,   Status:  Discontinued     12/05/17 2339    clindamycin (CLEOCIN) 150 MG capsule  Every 6 hours     12/05/17 2340       Princella Pellegrini 12/06/17 0104    Melene Plan, DO 12/06/17 1556

## 2017-12-05 NOTE — Discharge Instructions (Signed)
You were seen here for skin changes under your right neck.  Please take all of your antibiotics until finished!   You may develop abdominal discomfort or diarrhea from the antibiotic.  You may help offset this with probiotics which you can buy or get in yogurt. Do not eat or take the probiotics until 2 hours after your antibiotic. Do not take your medicine if develop an itchy rash, swelling in your mouth or lips, or difficulty breathing.  Please apply warm compresses to the area 4 times per day.  Please follow up with the ear, nose and throat (Dr. Jenne PaneBates) provider this week.  If you develop worsening or new concerning symptoms you can return to the emergency department for re-evaluation.  Please return if you develop any fever, vomiting, increasing swelling or the redness spreads/streaks up or down your neck
